# Patient Record
Sex: Male | Born: 1970 | Race: Black or African American | Hispanic: No | Marital: Single | Smoking: Current every day smoker
Health system: Southern US, Community
[De-identification: ages and names within clinical notes are randomized; demographics above are authoritative.]

## PROBLEM LIST (undated history)

## (undated) DIAGNOSIS — F319 Bipolar disorder, unspecified: Secondary | ICD-10-CM

---

## 2014-03-16 ENCOUNTER — Emergency Department (HOSPITAL_COMMUNITY)
Admission: EM | Admit: 2014-03-16 | Discharge: 2014-03-16 | Disposition: A | Discharge status: Home / Self Care | Payer: No Typology Code available for payment source | Attending: Emergency Medicine | Admitting: Emergency Medicine

## 2014-03-16 ENCOUNTER — Emergency Department (HOSPITAL_COMMUNITY): Payer: No Typology Code available for payment source

## 2014-03-16 ENCOUNTER — Encounter (HOSPITAL_COMMUNITY): Payer: Self-pay

## 2014-03-16 DIAGNOSIS — S3992XA Unspecified injury of lower back, initial encounter: Secondary | ICD-10-CM | POA: Diagnosis not present

## 2014-03-16 DIAGNOSIS — S29091A Other injury of muscle and tendon of front wall of thorax, initial encounter: Secondary | ICD-10-CM | POA: Diagnosis not present

## 2014-03-16 DIAGNOSIS — Y9389 Activity, other specified: Secondary | ICD-10-CM | POA: Diagnosis not present

## 2014-03-16 DIAGNOSIS — Y92410 Unspecified street and highway as the place of occurrence of the external cause: Secondary | ICD-10-CM | POA: Insufficient documentation

## 2014-03-16 DIAGNOSIS — S199XXA Unspecified injury of neck, initial encounter: Secondary | ICD-10-CM | POA: Diagnosis not present

## 2014-03-16 DIAGNOSIS — S29092A Other injury of muscle and tendon of back wall of thorax, initial encounter: Secondary | ICD-10-CM | POA: Diagnosis not present

## 2014-03-16 DIAGNOSIS — Y998 Other external cause status: Secondary | ICD-10-CM | POA: Diagnosis not present

## 2014-03-16 DIAGNOSIS — M545 Low back pain, unspecified: Secondary | ICD-10-CM

## 2014-03-16 LAB — I-STAT CHEM 8, ED
BUN: 16 mg/dL (ref 6–23)
Calcium, Ion: 1.18 mmol/L (ref 1.12–1.23)
Chloride: 105 mEq/L (ref 96–112)
Creatinine, Ser: 1.2 mg/dL (ref 0.50–1.35)
Glucose, Bld: 87 mg/dL (ref 70–99)
HCT: 53 % — ABNORMAL HIGH (ref 39.0–52.0)
HEMOGLOBIN: 18 g/dL — AB (ref 13.0–17.0)
Potassium: 3.4 mEq/L — ABNORMAL LOW (ref 3.7–5.3)
Sodium: 143 mEq/L (ref 137–147)
TCO2: 23 mmol/L (ref 0–100)

## 2014-03-16 LAB — I-STAT TROPONIN, ED: TROPONIN I, POC: 0 ng/mL (ref 0.00–0.08)

## 2014-03-16 MED ORDER — IBUPROFEN 800 MG PO TABS: 800.0000 mg | ORAL_TABLET | Freq: Three times a day (TID) | ORAL | Status: DC

## 2014-03-16 MED ORDER — HYDROCODONE-ACETAMINOPHEN 5-325 MG PO TABS
2.0000 | ORAL_TABLET | Freq: Once | ORAL | Status: AC
  Administered 2014-03-16: 2 via ORAL
  Filled 2014-03-16: qty 2

## 2014-03-16 MED ORDER — IOHEXOL 300 MG/ML  SOLN
100.0000 mL | Freq: Once | INTRAMUSCULAR | Status: AC | PRN
  Administered 2014-03-16: 100 mL via INTRAVENOUS

## 2014-03-16 MED ORDER — HYDROCODONE-ACETAMINOPHEN 5-325 MG PO TABS: 2.0000 | ORAL_TABLET | ORAL | Status: DC | PRN

## 2014-03-16 NOTE — ED Notes (Signed)
Pt to CT via stretcher. CT tech started IV on pt prior to leaving room.

## 2014-03-16 NOTE — ED Notes (Signed)
Ambulated Pt to end of hall ( approximal 60 ft) and back to his room (approximal 60 ft.),pt stated he was a little light headed during the walk back.
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
## 2014-03-16 NOTE — ED Notes (Signed)
Pt back from CT, MD in room to speak with patient about discharge. Brought pt a drink, he states no further needs at this time.
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
## 2014-03-16 NOTE — ED Notes (Signed)
MD at bedside. 
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
## 2017-05-11 ENCOUNTER — Inpatient Hospital Stay (HOSPITAL_COMMUNITY)
Admission: AD | Admit: 2017-05-11 | Discharge: 2017-05-15 | DRG: 885 | Payer: Federal, State, Local not specified - Other | Source: Intra-hospital | Attending: Psychiatry | Admitting: Psychiatry

## 2017-05-11 ENCOUNTER — Encounter (HOSPITAL_COMMUNITY): Payer: Self-pay | Admitting: *Deleted

## 2017-05-11 ENCOUNTER — Other Ambulatory Visit: Payer: Self-pay

## 2017-05-11 ENCOUNTER — Emergency Department (HOSPITAL_COMMUNITY)
Admission: EM | Admit: 2017-05-11 | Discharge: 2017-05-11 | Disposition: A | Discharge status: Home / Self Care | Payer: Self-pay | Attending: Emergency Medicine | Admitting: Emergency Medicine

## 2017-05-11 ENCOUNTER — Encounter (HOSPITAL_COMMUNITY): Payer: Self-pay

## 2017-05-11 DIAGNOSIS — Z79899 Other long term (current) drug therapy: Secondary | ICD-10-CM

## 2017-05-11 DIAGNOSIS — R45851 Suicidal ideations: Secondary | ICD-10-CM | POA: Insufficient documentation

## 2017-05-11 DIAGNOSIS — F250 Schizoaffective disorder, bipolar type: Secondary | ICD-10-CM | POA: Diagnosis not present

## 2017-05-11 DIAGNOSIS — Z886 Allergy status to analgesic agent status: Secondary | ICD-10-CM | POA: Diagnosis not present

## 2017-05-11 DIAGNOSIS — Z818 Family history of other mental and behavioral disorders: Secondary | ICD-10-CM

## 2017-05-11 DIAGNOSIS — Z23 Encounter for immunization: Secondary | ICD-10-CM | POA: Diagnosis not present

## 2017-05-11 DIAGNOSIS — F1590 Other stimulant use, unspecified, uncomplicated: Secondary | ICD-10-CM | POA: Diagnosis not present

## 2017-05-11 DIAGNOSIS — Z915 Personal history of self-harm: Secondary | ICD-10-CM | POA: Diagnosis not present

## 2017-05-11 DIAGNOSIS — Z590 Homelessness: Secondary | ICD-10-CM

## 2017-05-11 DIAGNOSIS — G4700 Insomnia, unspecified: Secondary | ICD-10-CM | POA: Diagnosis not present

## 2017-05-11 DIAGNOSIS — F419 Anxiety disorder, unspecified: Secondary | ICD-10-CM | POA: Diagnosis present

## 2017-05-11 DIAGNOSIS — F17210 Nicotine dependence, cigarettes, uncomplicated: Secondary | ICD-10-CM | POA: Diagnosis present

## 2017-05-11 DIAGNOSIS — F17200 Nicotine dependence, unspecified, uncomplicated: Secondary | ICD-10-CM | POA: Insufficient documentation

## 2017-05-11 DIAGNOSIS — F319 Bipolar disorder, unspecified: Secondary | ICD-10-CM | POA: Insufficient documentation

## 2017-05-11 DIAGNOSIS — F1910 Other psychoactive substance abuse, uncomplicated: Secondary | ICD-10-CM | POA: Insufficient documentation

## 2017-05-11 DIAGNOSIS — Z560 Unemployment, unspecified: Secondary | ICD-10-CM | POA: Diagnosis not present

## 2017-05-11 HISTORY — DX: Bipolar disorder, unspecified: F31.9

## 2017-05-11 LAB — CBC
HCT: 45.7 % (ref 39.0–52.0)
Hemoglobin: 15.5 g/dL (ref 13.0–17.0)
MCH: 29 pg (ref 26.0–34.0)
MCHC: 33.9 g/dL (ref 30.0–36.0)
MCV: 85.6 fL (ref 78.0–100.0)
Platelets: 297 10*3/uL (ref 150–400)
RBC: 5.34 MIL/uL (ref 4.22–5.81)
RDW: 13.9 % (ref 11.5–15.5)
WBC: 15.7 10*3/uL — ABNORMAL HIGH (ref 4.0–10.5)

## 2017-05-11 LAB — RAPID URINE DRUG SCREEN, HOSP PERFORMED
Amphetamines: NOT DETECTED
Barbiturates: NOT DETECTED
Benzodiazepines: NOT DETECTED
Cocaine: POSITIVE — AB
OPIATES: NOT DETECTED
Tetrahydrocannabinol: NOT DETECTED

## 2017-05-11 LAB — COMPREHENSIVE METABOLIC PANEL
ALT: 30 U/L (ref 17–63)
AST: 91 U/L — ABNORMAL HIGH (ref 15–41)
Albumin: 4.2 g/dL (ref 3.5–5.0)
Alkaline Phosphatase: 67 U/L (ref 38–126)
Anion gap: 11 (ref 5–15)
BUN: 19 mg/dL (ref 6–20)
CHLORIDE: 102 mmol/L (ref 101–111)
CO2: 23 mmol/L (ref 22–32)
CREATININE: 1.11 mg/dL (ref 0.61–1.24)
Calcium: 8.6 mg/dL — ABNORMAL LOW (ref 8.9–10.3)
GFR calc non Af Amer: >60 mL/min (ref >60)
Glucose, Bld: 96 mg/dL (ref 65–99)
POTASSIUM: 3.9 mmol/L (ref 3.5–5.1)
Sodium: 136 mmol/L (ref 135–145)
Total Bilirubin: 1.2 mg/dL (ref 0.3–1.2)
Total Protein: 7.9 g/dL (ref 6.5–8.1)

## 2017-05-11 LAB — ETHANOL

## 2017-05-11 LAB — SALICYLATE LEVEL: Salicylate Lvl: <7 mg/dL (ref 2.8–30.0)

## 2017-05-11 LAB — ACETAMINOPHEN LEVEL: Acetaminophen (Tylenol), Serum: <10 ug/mL — ABNORMAL LOW (ref 10–30)

## 2017-05-11 MED ORDER — NICOTINE POLACRILEX 2 MG MT GUM: 2.0000 mg | CHEWING_GUM | OROMUCOSAL | Status: DC | PRN

## 2017-05-11 MED ORDER — ACETAMINOPHEN 325 MG PO TABS
650.0000 mg | ORAL_TABLET | ORAL | Status: DC | PRN
Start: 2017-05-11 — End: 2017-05-11

## 2017-05-11 MED ORDER — NICOTINE 21 MG/24HR TD PT24
21.0000 mg | MEDICATED_PATCH | Freq: Every day | TRANSDERMAL | Status: DC
  Filled 2017-05-11: qty 1

## 2017-05-11 MED ORDER — QUETIAPINE FUMARATE 100 MG PO TABS: 100.0000 mg | ORAL_TABLET | Freq: Every day | ORAL | Status: DC

## 2017-05-11 MED ORDER — LORAZEPAM 1 MG PO TABS
0.0000 mg | ORAL_TABLET | Freq: Four times a day (QID) | ORAL | Status: DC
  Administered 2017-05-11: 1 mg via ORAL
  Filled 2017-05-11: qty 1

## 2017-05-11 MED ORDER — LAMOTRIGINE 25 MG PO TABS
25.0000 mg | ORAL_TABLET | Freq: Two times a day (BID) | ORAL | Status: DC
  Administered 2017-05-12: 25 mg via ORAL
  Filled 2017-05-11 (×7): qty 1

## 2017-05-11 MED ORDER — HYDROXYZINE HCL 25 MG PO TABS
25.0000 mg | ORAL_TABLET | Freq: Three times a day (TID) | ORAL | Status: DC | PRN
  Filled 2017-05-11: qty 1
  Filled 2017-05-11: qty 10

## 2017-05-11 MED ORDER — TRAZODONE HCL 50 MG PO TABS
50.0000 mg | ORAL_TABLET | Freq: Every evening | ORAL | Status: DC | PRN
  Filled 2017-05-11: qty 7

## 2017-05-11 MED ORDER — ALUM & MAG HYDROXIDE-SIMETH 200-200-20 MG/5ML PO SUSP
30.0000 mL | ORAL | Status: DC | PRN
Start: 2017-05-11 — End: 2017-05-15

## 2017-05-11 MED ORDER — PNEUMOCOCCAL VAC POLYVALENT 25 MCG/0.5ML IJ INJ
0.5000 mL | INJECTION | INTRAMUSCULAR | Status: AC
  Administered 2017-05-12: 0.5 mL via INTRAMUSCULAR

## 2017-05-11 MED ORDER — QUETIAPINE FUMARATE 100 MG PO TABS
100.0000 mg | ORAL_TABLET | Freq: Every day | ORAL | Status: DC
  Administered 2017-05-12: 100 mg via ORAL
  Filled 2017-05-11 (×5): qty 1

## 2017-05-11 MED ORDER — THIAMINE HCL 100 MG/ML IJ SOLN: 100.0000 mg | Freq: Every day | INTRAMUSCULAR | Status: DC

## 2017-05-11 MED ORDER — GABAPENTIN 300 MG PO CAPS
300.0000 mg | ORAL_CAPSULE | Freq: Three times a day (TID) | ORAL | Status: DC
  Administered 2017-05-11: 300 mg via ORAL
  Filled 2017-05-11: qty 1

## 2017-05-11 MED ORDER — INFLUENZA VAC SPLIT QUAD 0.5 ML IM SUSY
0.5000 mL | PREFILLED_SYRINGE | INTRAMUSCULAR | Status: AC
  Administered 2017-05-12: 0.5 mL via INTRAMUSCULAR
  Filled 2017-05-11: qty 0.5

## 2017-05-11 MED ORDER — ACETAMINOPHEN 325 MG PO TABS
650.0000 mg | ORAL_TABLET | Freq: Four times a day (QID) | ORAL | Status: DC | PRN
  Administered 2017-05-12 – 2017-05-13 (×2): 650 mg via ORAL
  Filled 2017-05-11: qty 2

## 2017-05-11 MED ORDER — GABAPENTIN 300 MG PO CAPS
300.0000 mg | ORAL_CAPSULE | Freq: Three times a day (TID) | ORAL | Status: DC
  Administered 2017-05-11 – 2017-05-15 (×10): 300 mg via ORAL
  Filled 2017-05-11 (×15): qty 1

## 2017-05-11 MED ORDER — LAMOTRIGINE 25 MG PO TABS
25.0000 mg | ORAL_TABLET | Freq: Two times a day (BID) | ORAL | Status: DC
  Administered 2017-05-11: 25 mg via ORAL
  Filled 2017-05-11: qty 1

## 2017-05-11 MED ORDER — ONDANSETRON HCL 4 MG PO TABS: 4.0000 mg | ORAL_TABLET | Freq: Three times a day (TID) | ORAL | Status: DC | PRN

## 2017-05-11 MED ORDER — VITAMIN B-1 100 MG PO TABS
100.0000 mg | ORAL_TABLET | Freq: Every day | ORAL | Status: DC
  Administered 2017-05-11: 100 mg via ORAL
  Filled 2017-05-11: qty 1

## 2017-05-11 MED ORDER — LORAZEPAM 2 MG/ML IJ SOLN: 0.0000 mg | Freq: Two times a day (BID) | INTRAMUSCULAR | Status: DC

## 2017-05-11 MED ORDER — LORAZEPAM 2 MG/ML IJ SOLN: 0.0000 mg | Freq: Four times a day (QID) | INTRAMUSCULAR | Status: DC

## 2017-05-11 MED ORDER — NICOTINE 21 MG/24HR TD PT24
21.0000 mg | MEDICATED_PATCH | Freq: Every day | TRANSDERMAL | Status: DC
  Administered 2017-05-11: 21 mg via TRANSDERMAL
  Filled 2017-05-11: qty 1

## 2017-05-11 MED ORDER — LORAZEPAM 1 MG PO TABS: 0.0000 mg | ORAL_TABLET | Freq: Two times a day (BID) | ORAL | Status: DC

## 2017-05-11 MED ORDER — MAGNESIUM HYDROXIDE 400 MG/5ML PO SUSP
30.0000 mL | Freq: Every day | ORAL | Status: DC | PRN
Start: 2017-05-11 — End: 2017-05-15

## 2017-05-11 NOTE — ED Notes (Signed)
Bed: WLPT4 Expected date:  Expected time:  Means of arrival:  Comments: 

## 2017-05-11 NOTE — ED Notes (Addendum)
Pt has two white patient belongings bags with clothes that are kept by the nurse's station in triage. Also, he has a screwdriver that was given to security for safekeeping.

## 2017-05-11 NOTE — ED Notes (Signed)
[facility]

## 2017-05-11 NOTE — BH Assessment (Signed)
[facility] Assessment Progress Note Case was staffed with Sharma CovertNorman MD who recommended a inpatient admission as appropriate bed placement is investigated.

## 2017-05-11 NOTE — BH Assessment (Signed)
[facility]  Per Juanetta BeetsJacqueline Norman, DO, this pt requires psychiatric hospitalization at this time.  Malva LimesLinsey Strader, RN, [facility]; [facility] will be ready to receive pt at 16:00.  Pt has signed Voluntary Admission and Consent for Treatment, as well as Consent to Release Information to [facility], and a notification call has been placed.  S[facility].  Pt's nurse, Donnal DebarRandi, has been notified, and agrees to send original paperwork along with pt via [facility], and to call report to [phone].  Doylene Canninghomas Hurbert Duran, [place] Behavioral Health Coordinator [phone]

## 2017-05-11 NOTE — ED Provider Notes (Signed)
[facility]-EMERGENCY DEPT Provider Note   CSN: [id number] Arrival date & time: 05/11/17  7829     History   Chief Complaint Chief Complaint  Patient presents with  . Suicidal    HPI Clifford [name] is a 47 y.o. male.  Patient presents by police with suicidal thoughts and hallucinations.  States he has been off of his bipolar medications for the past 5 days.  He is not sure what he is supposed to take but believes his Depakote, Remeron and Lamictal.  He has been abusing cocaine, heroin and amphetamines.  He denies any injection drug use.  He has been smoking these drugs and not taking any pills.  Denies any regular alcohol use.  He is worried that his thoughts would "provoke someone to kill me".  Patient is worried that he is having thoughts of wanting to hurt himself at this time.  He denies any pain.  No chest pain or abdominal pain.  No headache.   The history is provided by the patient and the police.    Past Medical History:  Diagnosis Date  . Bipolar 1 disorder (HCC)     There are no active problems to display for this patient.   History reviewed. No pertinent surgical history.     Home Medications    Prior to Admission medications   Medication Sig Start Date End Date Taking? Authorizing Provider  HYDROcodone-acetaminophen (NORCO/VICODIN) 5-325 MG per tablet Take 2 tablets by mouth every 4 (four) hours as needed. 03/16/14   Zona Pedro, Jeannett Senior, MD  ibuprofen (ADVIL,MOTRIN) 800 MG tablet Take 1 tablet (800 mg total) by mouth 3 (three) times daily. 03/16/14   Glynn Octave, MD    Family History No family history on file.  Social History Social History   Tobacco Use  . Smoking status: Current Every Day Smoker  . Smokeless tobacco: Never Used  Substance Use Topics  . Alcohol use: Yes    Comment: social  . Drug use: Yes    Types: Cocaine, Methamphetamines    Comment: heroin     Allergies   Aspirin   Review of Systems Review of  Systems  Constitutional: Negative for activity change, appetite change and fever.  HENT: Negative for congestion.   Respiratory: Negative for cough, chest tightness and shortness of breath.   Cardiovascular: Negative for chest pain.  Gastrointestinal: Negative for abdominal pain, nausea and vomiting.  Genitourinary: Negative for dysuria, hematuria and testicular pain.  Musculoskeletal: Negative for arthralgias and myalgias.  Skin: Negative for rash.  Neurological: Negative for dizziness, weakness and headaches.  Psychiatric/Behavioral: Positive for behavioral problems, decreased concentration, dysphoric mood, hallucinations, self-injury and suicidal ideas. The patient is nervous/anxious.     all other systems are negative except as noted in the HPI and PMH.    Physical Exam Updated Vital Signs BP (!) 150/99   Pulse 70   Temp 97.7 F (36.5 C) (Oral)   Resp 18   Ht 5\' 10"  (1.778 m)   Wt 97.5 kg (215 lb)   SpO2 95%   BMI 30.85 kg/m   Physical Exam  Constitutional: He is oriented to person, place, and time. He appears well-developed and well-nourished. No distress.  Calm and cooperative  HENT:  Head: Normocephalic and atraumatic.  Mouth/Throat: Oropharynx is clear and moist. No oropharyngeal exudate.  Eyes: Conjunctivae and EOM are normal. Pupils are equal, round, and reactive to light.  Neck: Normal range of motion. Neck supple.  No meningismus.  Cardiovascular: Normal rate,  regular rhythm, normal heart sounds and intact distal pulses.  No murmur heard. Pulmonary/Chest: Effort normal and breath sounds normal. No respiratory distress. He exhibits no tenderness.  Abdominal: Soft. There is no tenderness. There is no rebound and no guarding.  Musculoskeletal: Normal range of motion. He exhibits no edema or tenderness.  Neurological: He is alert and oriented to person, place, and time. No cranial nerve deficit. He exhibits normal muscle tone. Coordination normal.  No ataxia on  finger to nose bilaterally. No pronator drift. 5/5 strength throughout. CN 2-12 intact.Equal grip strength. Sensation intact.   Skin: Skin is warm. Capillary refill takes less than 2 seconds.  Psychiatric: He has a normal mood and affect. His behavior is normal.  Nursing note and vitals reviewed.    ED Treatments / Results  Labs (all labs ordered are listed, but only abnormal results are displayed) Labs Reviewed  COMPREHENSIVE METABOLIC PANEL - Abnormal; Notable for the following components:      Result Value   Calcium 8.6 (*)    AST 91 (*)    All other components within normal limits  ACETAMINOPHEN LEVEL - Abnormal; Notable for the following components:   Acetaminophen (Tylenol), Serum <10 (*)    All other components within normal limits  CBC - Abnormal; Notable for the following components:   WBC 15.7 (*)    All other components within normal limits  RAPID URINE DRUG SCREEN, HOSP PERFORMED - Abnormal; Notable for the following components:   Cocaine POSITIVE (*)    All other components within normal limits  ETHANOL  SALICYLATE LEVEL    EKG  EKG Interpretation None       Radiology No results found.  Procedures Procedures (including critical care time)  Medications Ordered in ED Medications - No data to display   Initial Impression / Assessment and Plan / ED Course  I have reviewed the triage vital signs and the nursing notes.  Pertinent labs & imaging results that were available during my care of the patient were reviewed by me and considered in my medical decision making (see chart for details).    Patient brought in police with suicidal and homicidal thoughts and polysubstance abuse.  Patient is calm and cooperative.  He admits to using multiple substances including cocaine, heroin and amphetamine.  Screening labs are unremarkable except for nonspecific leukocytosis.  No evidence of infection.  Patient is medically clear for TTS evaluation.  Holding orders  placed  Final Clinical Impressions(s) / ED Diagnoses   Final diagnoses:  Polysubstance abuse [facility](HCC)  Suicidal ideation    ED Discharge Orders    None       Dicie Edelen, Jeannett SeniorStephen, MD 05/11/17 [phone]

## 2017-05-11 NOTE — Progress Notes (Signed)
Clifford [name] is a 47 year old male being admitted voluntarily to [facility] from [facility].  He came in with suicidal ideation, homicidal ideation and substance abuse.  [facility], he continues to voice passive SI/HI and will contract for safety on the unit.  He denies HI towards anyone specific.  He reported drinking 6-8 shots of liquor daily.  He reported relapsing shortly after being released from prison in August.  He does report relapsing on crack x 2 since August as well.  He is reporting hearing voices telling him to kill himself and are negative towards him.  Oriented him to the unit.  Admission paperwork completed and signed.  Belongings searched and secured in locker # 27, no contraband found.  Skin assessment completed and no skin issues noted.  Q 15 minute checks initiated for safety.  We will continue to monitor the progress towards his goals.

## 2017-05-11 NOTE — Tx Team (Signed)
Initial Treatment Plan 05/11/2017 6:06 PM Clifford SevinIke [name] ZOX:[id number]    PATIENT STRESSORS: Financial difficulties Legal issue Substance abuse   PATIENT STRENGTHS: Wellsite geologistCommunication skills General fund of knowledge Motivation for treatment/growth   PATIENT IDENTIFIED PROBLEMS: Depression  Suicidal/homicidal ideation  Substance abuse  Psychosis  "Hopefully leave out of here a better person"  "Get on some medications so I can be a positive person"           DISCHARGE CRITERIA:  Improved stabilization in mood, thinking, and/or behavior Verbal commitment to aftercare and medication compliance Withdrawal symptoms are absent or subacute and managed without 24-hour nursing intervention  PRELIMINARY DISCHARGE PLAN: Outpatient therapy Medication management  PATIENT/FAMILY INVOLVEMENT: This treatment plan has been presented to and reviewed with the patient, Clifford [name].  The patient and family have been given the opportunity to ask questions and make suggestions.  Clifford BaconHeather V Lavi Sheehan, RN 05/11/2017, 6:06 PM

## 2017-05-11 NOTE — ED Notes (Signed)
Bed: WA31 Expected date:  Expected time:  Means of arrival:  Comments: 

## 2017-05-11 NOTE — ED Triage Notes (Signed)
Pt brought in voluntary by [employer] with SI thoughts, states he would "provoke someone to kill me". Reports being off his meds since Wednesday. Reports hearing voices. Last used cocaine today.
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
## 2017-05-11 NOTE — ED Notes (Signed)
ED Provider at bedside. 

## 2017-05-12 DIAGNOSIS — Z79899 Other long term (current) drug therapy: Secondary | ICD-10-CM

## 2017-05-12 DIAGNOSIS — G4700 Insomnia, unspecified: Secondary | ICD-10-CM

## 2017-05-12 DIAGNOSIS — F250 Schizoaffective disorder, bipolar type: Principal | ICD-10-CM

## 2017-05-12 DIAGNOSIS — F419 Anxiety disorder, unspecified: Secondary | ICD-10-CM

## 2017-05-12 LAB — LIPID PANEL
CHOLESTEROL: 177 mg/dL (ref 0–200)
HDL: 43 mg/dL (ref >40)
LDL Cholesterol: 109 mg/dL — ABNORMAL HIGH (ref 0–99)
Total CHOL/HDL Ratio: 4.1 RATIO
Triglycerides: 125 mg/dL (ref <150)
VLDL: 25 mg/dL (ref 0–40)

## 2017-05-12 LAB — HEMOGLOBIN A1C
Hgb A1c MFr Bld: 5.8 % — ABNORMAL HIGH (ref 4.8–5.6)
Mean Plasma Glucose: 119.76 mg/dL

## 2017-05-12 LAB — TSH: TSH: 0.346 u[IU]/mL — AB (ref 0.350–4.500)

## 2017-05-12 MED ORDER — DIVALPROEX SODIUM 500 MG PO DR TAB
500.0000 mg | DELAYED_RELEASE_TABLET | Freq: Two times a day (BID) | ORAL | Status: DC
  Administered 2017-05-12 – 2017-05-15 (×7): 500 mg via ORAL
  Filled 2017-05-12 (×10): qty 1

## 2017-05-12 NOTE — Progress Notes (Signed)
DAR NOTE: Patient presents with flat affect and depressed mood.  Reports suicidal thoughts, auditory and visual hallucinations but verbally contracts for safety.  Described energy level as low and concentration as poor.  Rates depression at 7, hopelessness at 7, and anxiety at 7.  Maintained on routine safety checks.  Medications given as prescribed.  Support and encouragement offered as needed.  Attended group and participated.  States goal for today is "feeling better."  Patient visible in milieu with minimal interactions.  Offered no complaint.

## 2017-05-12 NOTE — Progress Notes (Signed)
Recreation Therapy Notes  Date: 05/12/17 Time: 1000 Location: 500 Hall Dayroom  Group Topic: Communication  Goal Area(s) Addresses:  Patient will effectively communicate with peers in group.  Patient will verbalize benefit of healthy communication. Patient will verbalize positive effect of healthy communication on post d/c goals.  Patient will identify communication techniques that made activity effective for group.   Behavioral Response: Minimal  Intervention: Geometrical drawings, blank paper, pencils    Activity: Geometrical Drawings.  One patient would describe [name] picture to the rest of the group.  The group would then draw the picture being described to them on their own individual sheets of paper.  The drawers could not ask any questions of the reader.  When completed, the group would examine their drawings against the original to see how close they came.  Education: Communication, Discharge Planning  Education Outcome: Acknowledges understanding/In group clarification offered/Needs additional education.   Clinical Observations/Feedback: Pt arrived late.  Pt did not draw the pictures.  Pt stated during processing, that speaking clear and loud enough for the person to hear you, helps to make sure your point is getting across.  Pt also stated he didn't think hand gestures were offensive because "I've seen professional people use it and sometimes it helps the person better explain what they are saying".    Caroll RancherMarjette Shawana Knoch, LRT/CTRS     Caroll RancherLindsay, Clifford [name] 05/12/2017 11:24 AM
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
## 2017-05-12 NOTE — Progress Notes (Signed)
Adult Psychoeducational Group Note  Date:  05/12/2017 Time:  8:47 PM  Group Topic/Focus:  Wrap-Up Group:   The focus of this group is to help patients review their daily goal of treatment and discuss progress on daily workbooks.  Participation Level:  Active  Participation Quality:  Appropriate  Affect:  Appropriate  Cognitive:  Appropriate  Insight: Appropriate  Engagement in Group:  Engaged  Modes of Intervention:  Discussion  Additional Comments:The patient expressed that he attended Recovery and Diagnosis Group.  Octavio Mannshigpen, Dhwani Venkatesh Lee 05/12/2017, 8:47 PM

## 2017-05-12 NOTE — [facility] Group Notes (Signed)
[facility] LCSW Group Therapy Note  Date/Time: 05/11/17, 1100  Type of Therapy/Topic:  Group Therapy:  Feelings about Diagnosis  Participation Level:  Active   Mood:pleasant   Description of Group:    This group will allow patients to explore their thoughts and feelings about diagnoses they have received. Patients will be guided to explore their level of understanding and acceptance of these diagnoses. Facilitator will encourage patients to process their thoughts and feelings about the reactions of others to their diagnosis, and will guide patients in identifying ways to discuss their diagnosis with significant others in their lives. This group will be process-oriented, with patients participating in exploration of their own experiences as well as giving and receiving support and challenge from other group members.   Therapeutic Goals: 1. Patient will demonstrate understanding of diagnosis as evidence by identifying two or more symptoms of the disorder:  2. Patient will be able to express two feelings regarding the diagnosis 3. Patient will demonstrate ability to communicate their needs through discussion and/or role plays  Summary of Patient Progress:Pt was attentive and showed good participation in group.  Pt shared about his experiences with "stigma" and with family members telling him that "you don't need those pills".  Pt also shared that he has been given several diagnoses by several different MDs, leading to him not being sure what his actual diagnosis is.        Therapeutic Modalities:   Cognitive Behavioral Therapy Brief Therapy Feelings Identification   Daleen SquibbGreg Elston Aldape, LCSW

## 2017-05-12 NOTE — BHH Suicide Risk Assessment (Signed)
[facility] Admission Suicide Risk Assessment   Nursing information obtained from:  Patient Demographic factors:  Male, Low socioeconomic status, Unemployed Current Mental Status:  Self-harm thoughts Loss Factors:  Decrease in vocational status, Legal issues, Financial problems / change in socioeconomic status Historical Factors:  Prior suicide attempts, Family history of mental illness or substance abuse Risk Reduction Factors:  Living with another person, especially a relative  Total Time spent with patient: 1 hour Principal Problem: Schizoaffective disorder, bipolar type (HCC) Diagnosis:   Patient Active Problem List   Diagnosis Date Noted  . Schizoaffective disorder, bipolar type (HCC) [F25.0] 05/11/2017   Subjective Data: See H&P for full HPI Clifford [name] is a 47 y/o M with history of schizophrenia and bipolar disorder who was admitted voluntarily with worsening symptoms of depression, SI with plan to shoot self, AH, and illicit substance use of crack cocaine, heroin, methamphetamine, and alcohol. Pt has recent relevant history of discharge from prison in August 2018 and he was set up with outpatient services at [facility], and he reports good adherence to his outpatient regimen until about 1 week ago when he had relapse of substance use. Pt agreed to be restarted on depakote and he will start trial of seroquel.   Continued Clinical Symptoms:  Alcohol Use Disorder Identification Test Final Score (AUDIT): 29 The "Alcohol Use Disorders Identification Test", Guidelines for Use in Primary Care, Second Edition.  World Science writer [facility]). Score between 0-7:  no or low risk or alcohol related problems. Score between 8-15:  moderate risk of alcohol related problems. Score between 16-19:  high risk of alcohol related problems. Score 20 or above:  warrants further diagnostic evaluation for alcohol dependence and treatment.   CLINICAL FACTORS:   Severe Anxiety and/or Agitation Bipolar Disorder:    Depressive phase Alcohol/Substance Abuse/Dependencies Schizophrenia:   Command hallucinatons Paranoid or undifferentiated type Currently Psychotic Unstable or Poor Therapeutic Relationship   Musculoskeletal: Strength & Muscle Tone: within normal limits Gait & Station: normal Patient leans: N/A  Psychiatric Specialty Exam: Physical Exam  Nursing note and vitals reviewed.   ROS - see H&P  Blood pressure (!) 135/94, pulse 77, temperature 98.8 F (37.1 C), temperature source Oral, resp. rate 18, height 5\' 10"  (1.778 m), weight 95.3 kg (210 lb).Body mass index is 30.13 kg/m.  General Appearance: Casual  Eye Contact:  Good  Speech:  Clear and Coherent and Normal Rate  Volume:  Normal  Mood:  Anxious  Affect:  Appropriate, Congruent, Constricted and Flat  Thought Process:  Coherent and Goal Directed  Orientation:  Full (Time, Place, and Person)  Thought Content:  Hallucinations: Auditory Command:  to kill self and others  Suicidal Thoughts:  Yes.  with intent/plan  Homicidal Thoughts:  No  Memory:  Immediate;   Fair Recent;   Fair Remote;   Fair  Judgement:  Impaired  Insight:  Lacking  Psychomotor Activity:  Normal  Concentration:  Concentration: Fair  Recall:  [employer] of Knowledge:  Fair  Language:  Fair  Akathisia:  No  Handed:    AIMS (if indicated):     Assets:  Communication Skills Desire for Improvement Leisure Time Physical Health Resilience  ADL's:  Intact  Cognition:  WNL  Sleep:  Number of Hours: 6.75        COGNITIVE FEATURES THAT CONTRIBUTE TO RISK:  None    SUICIDE RISK:   Mild:  Suicidal ideation of limited frequency, intensity, duration, and specificity.  There are no identifiable plans, no  associated intent, mild dysphoria and related symptoms, good self-control (both objective and subjective assessment), few other risk factors, and identifiable protective factors, including available and accessible social support.  PLAN OF CARE:    -Admit to inpatient level of care  Labs: CBC, CMP, TSH, lipid panel, prolactin, HGBA1C, EKG  -Schizoaffective disorder, bipolar type   - Restart depakote DR 500mg  po BID   - Start seroquel 100mg  po qhs  -Anxiety  - Continue gabapentin 300mg  po TID  - Continue atarax 25mg  po TID prn anxiety  -Insomnia   - Start trazodone 50mg  po qhs prn insomnia  -Encourage participation in groups and the therapeutic milieu  -Discharge planning will be ongoing  I certify that inpatient services furnished can reasonably be expected to improve the patient's condition.   Clifford Likenshristopher T Alenna Russell, MD 05/12/2017, 5:09 PM
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
## 2017-05-12 NOTE — Progress Notes (Signed)
Adult Psychoeducational Group Note  Date:  05/12/2017 Time:  6:39 PM  Group Topic/Focus:  Recovery Goals:   The focus of this group is to identify appropriate goals for recovery and establish a plan to achieve them.  Participation Level:  Active  Participation Quality:  Appropriate  Affect:  Appropriate  Cognitive:  Appropriate  Insight: Appropriate  Engagement in Group:  Engaged  Modes of Intervention:  Discussion  Additional Comments:  Pt did actively participated, and discussed what the meaning of recovery was to him.  Wynema BirchCagle, Kayla Weekes D 05/12/2017, 6:39 PM

## 2017-05-12 NOTE — Progress Notes (Signed)
Recreation Therapy Notes  INPATIENT RECREATION THERAPY ASSESSMENT  Patient Details Name: Clifford [name] MRN: [id number] DOB: 12/13/1970 Today's Date: 05/12/2017       Information Obtained From: Patient  Able to Participate in Assessment/Interview: Yes  Patient Presentation: Alert, Oriented, Responsive  Reason for Admission (Per Patient): Suicidal Ideation  Patient Stressors: Work(Not being able to find a job)  Coping Skills:   Counselling psychologistelf-Injury, Music, Aggression, TV, Sports, Meditate, Substance Abuse, Impulsivity, Talk, Avoidance, Read  Leisure Interests (2+):  Exercise - Lifting Weights, Individual - Other (Comment)(Watch sports, the internet)  Frequency of Recreation/Participation: Weekly  Awareness of Community Resources:  Yes  Community Resources:  [place], [place]  Current Use: No  If no, Barriers?: Other (Comment)(Couldn't get mind focused)  Expressed Interest in State Street CorporationCommunity Resource Information: Yes  Patient Main Form of Transportation: Therapist, musicublic Transportation  Patient Strengths:  Independent, Survivor  Patient Identified Areas of Improvement:  Mental State, Substance Abuse  Current Recreation Participation:  Weekly  Patient Goal for Hospitalization:  "Learn how to manage depression and stay away from substance"  [place]  [place]  Current SI (including self-harm):  Yes(Rated a 7.5; Contracts for safety)  Current HI:  No  Current AVH: Yes(Rated a 7.5)  Staff Intervention Plan: Group Attendance  Consent to Intern Participation: N/A    Caroll RancherMarjette Jeziel Hoffmann, LRT/CTRS  Lillia AbedLindsay, Seraphim Affinito A 05/12/2017, 11:12 AM

## 2017-05-12 NOTE — Progress Notes (Signed)
Nursing Progress Note: 7p-7a D: Pt currently presents with a sad/depressed affect and behavior. Pt states "I see dots and hear voices to tell me to do weird shit like hurt people." Interacting minimally with the milieu. Pt reports good sleep during the previous night with current medication regimen. Pt did not attend wrap-up group.  A: Pt provided with medications per providers orders. Pt's labs and vitals were monitored throughout the night. Pt supported emotionally and encouraged to express concerns and questions. Pt educated on medications.  R: Pt's safety ensured with 15 minute and environmental checks. Pt currently denies SI, HI, and AVH. Pt verbally contracts to seek staff if SI,HI, or AVH occurs and to consult with staff before acting on any harmful thoughts. Will continue to monitor.

## 2017-05-13 LAB — PROLACTIN: Prolactin: 14.3 ng/mL (ref 4.0–15.2)

## 2017-05-13 MED ORDER — QUETIAPINE FUMARATE 50 MG PO TABS
150.0000 mg | ORAL_TABLET | Freq: Every day | ORAL | Status: DC
  Administered 2017-05-13 – 2017-05-14 (×2): 150 mg via ORAL
  Filled 2017-05-13 (×4): qty 1

## 2017-05-13 NOTE — Progress Notes (Signed)
Recreation Therapy Notes  Date: 05/13/17 Time: 1000 Location: 500 Hall Dayroom  Group Topic: Coping Skills  Goal Area(s) Addresses:  Patient will be able to identify positive coping skills. Patient will be able to identify benefits of using coping skills post d/c.  Intervention: Worksheet, pencils   Activity: Web Design.  Patients were to write the things that have had them stuck or stressed inside of the web.  Patients were to then come up with at least three coping skills and write them outside of the web.  Education: Coping Skills, Discharge Planning.   Education Outcome: Acknowledges understanding/In group clarification offered/Needs additional education.   Clinical Observations/Feedback: Pt did not attend group.    Bladyn Tipps, LRT/CTRS         Terre Zabriskie A 05/13/2017 12:07 PM 

## 2017-05-13 NOTE — [facility] Suicide Risk Assessment (Signed)
[facility] INPATIENT:  Family/Significant Other Suicide Prevention Education  Suicide Prevention Education:  Patient Refusal for Family/Significant Other Suicide Prevention Education: The patient Clifford [name] has refused to provide written consent for family/significant other to be provided Family/Significant Other Suicide Prevention Education during admission and/or prior to discharge.  Physician notified.  Lorri FrederickWierda, Chantell Kunkler Jon, LCSW 05/13/2017, 11:42 AM

## 2017-05-13 NOTE — Progress Notes (Signed)
Patient stated he still has SI thoughts, contracts for safety, no plan at this time.  Denied HI.  Stated he continues to have A/V hallucinations but they are better now also.  Did not want to discuss at this time.

## 2017-05-13 NOTE — Tx Team (Signed)
Interdisciplinary Treatment and Diagnostic Plan Update  05/13/2017 Time of Session: 0930 Rudolpho Sevinke [name] MRN: [id number]  Principal Diagnosis: Schizoaffective disorder, bipolar type [facility](HCC)  Secondary Diagnoses: Principal Problem:   Schizoaffective disorder, bipolar type (HCC)   Current Medications:  Current Facility-Administered Medications  Medication Dose Route Frequency Provider Last Rate Last Dose  . acetaminophen (TYLENOL) tablet 650 mg  650 mg Oral Q6H PRN Laveda AbbeParks, Laurie Britton, NP   650 mg at 05/12/17 1017  . alum & mag hydroxide-simeth (MAALOX/MYLANTA) 200-200-20 MG/5ML suspension 30 mL  30 mL Oral Q4H PRN Laveda AbbeParks, Laurie Britton, NP      . divalproex (DEPAKOTE) DR tablet 500 mg  500 mg Oral Q12H Micheal Likensainville, Christopher T, MD   500 mg at 05/13/17 0742  . gabapentin (NEURONTIN) capsule 300 mg  300 mg Oral TID Laveda AbbeParks, Laurie Britton, NP   300 mg at 05/13/17 [id number]  . hydrOXYzine (ATARAX/VISTARIL) tablet 25 mg  25 mg Oral TID PRN Laveda AbbeParks, Laurie Britton, NP      . magnesium hydroxide (MILK OF MAGNESIA) suspension 30 mL  30 mL Oral Daily PRN Laveda AbbeParks, Laurie Britton, NP      . nicotine polacrilex (NICORETTE) gum 2 mg  2 mg Oral PRN Micheal Likensainville, Christopher T, MD      . ondansetron [facility](ZOFRAN) tablet 4 mg  4 mg Oral Q8H PRN Laveda AbbeParks, Laurie Britton, NP      . QUEtiapine (SEROQUEL) tablet 100 mg  100 mg Oral QHS Laveda AbbeParks, Laurie Britton, NP   100 mg at 05/12/17 2200  . traZODone (DESYREL) tablet 50 mg  50 mg Oral QHS PRN Laveda AbbeParks, Laurie Britton, NP       PTA Medications: Medications Prior to Admission  Medication Sig Dispense Refill Last Dose  . divalproex (DEPAKOTE ER) 500 MG 24 hr tablet Take 500 mg by mouth daily.   Past Week at Unknown time  . mirtazapine (REMERON) 15 MG tablet Take 15 mg by mouth at bedtime.    Past Week at Unknown time  . OLANZapine (ZYPREXA) 10 MG tablet Take 10 mg by mouth at bedtime.   Past Week at Unknown time    Patient Stressors: Paediatric nurseinancial difficulties Legal issue Substance  abuse  Patient Strengths: DentistCommunication skills General fund of knowledge Motivation for treatment/growth  Treatment Modalities: Medication Management, Group therapy, Case management,  1 to 1 session with clinician, Psychoeducation, Recreational therapy.   Physician Treatment Plan for Primary Diagnosis: Schizoaffective disorder, bipolar type (HCC) Long Term Goal(s): Improvement in symptoms so as ready for discharge Improvement in symptoms so as ready for discharge   Short Term Goals: Compliance with prescribed medications will improve Ability to identify triggers associated with substance abuse/mental health issues will improve  Medication Management: Evaluate patient's response, side effects, and tolerance of medication regimen.  Therapeutic Interventions: 1 to 1 sessions, Unit Group sessions and Medication administration.  Evaluation of Outcomes: Progressing  Physician Treatment Plan for Secondary Diagnosis: Principal Problem:   Schizoaffective disorder, bipolar type (HCC)  Long Term Goal(s): Improvement in symptoms so as ready for discharge Improvement in symptoms so as ready for discharge   Short Term Goals: Compliance with prescribed medications will improve Ability to identify triggers associated with substance abuse/mental health issues will improve     Medication Management: Evaluate patient's response, side effects, and tolerance of medication regimen.  Therapeutic Interventions: 1 to 1 sessions, Unit Group sessions and Medication administration.  Evaluation of Outcomes: Progressing   RN Treatment Plan for Primary Diagnosis: Schizoaffective disorder, bipolar type (HCC) Long Term  Goal(s): Knowledge of disease and therapeutic regimen to maintain health will improve  Short Term Goals: Ability to identify and develop effective coping behaviors will improve and Compliance with prescribed medications will improve  Medication Management: RN will administer medications as  ordered by provider, will assess and evaluate patient's response and provide education to patient for prescribed medication. RN will report any adverse and/or side effects to prescribing provider.  Therapeutic Interventions: 1 on 1 counseling sessions, Psychoeducation, Medication administration, Evaluate responses to treatment, Monitor vital signs and CBGs as ordered, Perform/monitor CIWA, COWS, AIMS and Fall Risk screenings as ordered, Perform wound care treatments as ordered.  Evaluation of Outcomes: Progressing   LCSW Treatment Plan for Primary Diagnosis: Schizoaffective disorder, bipolar type (HCC) Long Term Goal(s): Safe transition to appropriate next level of care at discharge, Engage patient in therapeutic group addressing interpersonal concerns.  Short Term Goals: Engage patient in aftercare planning with referrals and resources, Increase social support and Increase skills for wellness and recovery  Therapeutic Interventions: Assess for all discharge needs, 1 to 1 time with Social worker, Explore available resources and support systems, Assess for adequacy in community support network, Educate family and significant other(s) on suicide prevention, Complete Psychosocial Assessment, Interpersonal group therapy.  Evaluation of Outcomes: Progressing   Progress in Treatment: Attending groups: Yes. Participating in groups: Yes. Taking medication as prescribed: Yes. Toleration medication: Yes. Family/Significant other contact made: No, will contact:  pt declined Patient understands diagnosis: Yes. Discussing patient identified problems/goals with staff: Yes. Medical problems stabilized or resolved: Yes. Denies suicidal/homicidal ideation: Yes. Issues/concerns per patient self-inventory: No. Other: none  New problem(s) identified: No, Describe:  none  New Short Term/Long Term Goal(s):Pt goal: get better, stop the voices, help with substance use.  Discharge Plan or Barriers: Pursue  residential substance use treatment.  Reason for Continuation of Hospitalization: Hallucinations Medication stabilization  Estimated Length of Stay: 3-5 days.  Attendees: Patient:Clifford [name] 05/13/2017   Physician: Dr Altamese Vermillion, MD 05/13/2017   Nursing: Elby Beck, RN 05/13/2017   RN Care Manager: 05/13/2017   Social Worker: Daleen Squibb, LCSW 05/13/2017   Recreational Therapist:    Other: Enid Cutter, intern 05/13/2017   Other:  05/13/2017   Other: 05/13/2017     Scribe for Treatment Team: Lorri Frederick, LCSW 05/13/2017 10:12 AM

## 2017-05-13 NOTE — Progress Notes (Signed)
Adult Psychoeducational Group Note  Date:  05/13/2017 Time:  4:46 PM  Group Topic/Focus:  Developing a Wellness Toolbox:   The focus of this group is to help patients develop a "wellness toolbox" with skills and strategies to promote recovery upon discharge.  Participation Level:  Active  Participation Quality:  Appropriate and Attentive  Affect:  Appropriate  Cognitive:  Alert and Appropriate  Insight: Appropriate and Good  Engagement in Group:  Engaged and Improving  Modes of Intervention:  Discussion and Education  Additional Comments:  Topic was on personal development.  Patient was attentive and participated.  Audrie Lializabeth O Shreyan Hinz 05/13/2017, 4:46 PM

## 2017-05-13 NOTE — Progress Notes (Signed)
[facility] MD Progress Note  05/13/2017 4:46 PM Clifford [name]  MRN:  [id number] Subjective:    Clifford [name] is a 47 y/o M with history of schizophrenia and bipolar disorder who was admitted voluntarily with worsening symptoms of depression, SI with plan to shoot self, AH, and illicit substance use of crack cocaine, heroin, methamphetamine, and alcohol. Pt has recent relevant history of discharge from prison in August 2018 and he was set up with outpatient services at [facility], and he reports good adherence to his outpatient regimen until about 1 week ago when he had relapse of substance use. Pt agreed to be restarted on previous medication of depakote and to attempt trial of seroquel. Pt worked with SW team about plan to seek substance use treatment at [facility] after stabilization at [facility].  Today upon evaluation, pt shares, "I'm doing better - starting to feel better; the voices are coming down and the suicidal thoughts are coming down." Pt rates AH intensity of "5/10" as compared to "10/10" on arrival, and he rates SI as "5/10" in intensity compared to "10/10" on arrival. He denies HI/VH. He is sleeping well. His appetite is good. He feels that his medications have been helpful, and he would like to continue with them. Discussed with patient about potential of increasing dose of seroquel at bedtime, and he was in agreement. Pt remains in agreement with plan for transfer to [facility] for substance use treatment after he is stabilized at [facility], and we discussed potential discharge on morning of Tuesday 2/19, and pt felt that he would likely be ready at that point if he continued to have improvement of his presenting symptoms. Pt was in agreement with the above plan, and he had no further questions, comments, or concerns.   Principal Problem: Schizoaffective disorder, bipolar type (HCC) Diagnosis:   Patient Active Problem List   Diagnosis Date Noted  . Schizoaffective disorder, bipolar type (HCC) [F25.0] 05/11/2017    Total Time spent with patient: 30 minutes  Past Psychiatric History: see H&P  Past Medical History:  Past Medical History:  Diagnosis Date  . Bipolar 1 disorder (HCC)    History reviewed. No pertinent surgical history. Family History: History reviewed. No pertinent family history. Family Psychiatric  History: see H&P Social History:  Social History   Substance and Sexual Activity  Alcohol Use Yes   Comment: social     Social History   Substance and Sexual Activity  Drug Use Yes  . Types: Cocaine, Methamphetamines   Comment: heroin    Social History   Socioeconomic History  . Marital status: Single    Spouse name: None  . Number of children: None  . Years of education: None  . Highest education level: None  Social Needs  . Financial resource strain: None  . Food insecurity - worry: None  . Food insecurity - inability: None  . Transportation needs - medical: None  . Transportation needs - non-medical: None  Occupational History  . None  Tobacco Use  . Smoking status: Current Every Day Smoker  . Smokeless tobacco: Never Used  Substance and Sexual Activity  . Alcohol use: Yes    Comment: social  . Drug use: Yes    Types: Cocaine, Methamphetamines    Comment: heroin  . Sexual activity: None  Other Topics Concern  . None  Social History Narrative  . None   Additional Social History:  Sleep: Good  Appetite:  Good  Current Medications: Current Facility-Administered Medications  Medication Dose Route Frequency Provider Last Rate Last Dose  . acetaminophen (TYLENOL) tablet 650 mg  650 mg Oral Q6H PRN Laveda AbbeParks, Laurie Britton, NP   650 mg at 05/12/17 1017  . alum & mag hydroxide-simeth (MAALOX/MYLANTA) 200-200-20 MG/5ML suspension 30 mL  30 mL Oral Q4H PRN Laveda AbbeParks, Laurie Britton, NP      . divalproex (DEPAKOTE) DR tablet 500 mg  500 mg Oral Q12H Micheal Likensainville, Tyeler Goedken T, MD   500 mg at 05/13/17 0742  . gabapentin (NEURONTIN)  capsule 300 mg  300 mg Oral TID Laveda AbbeParks, Laurie Britton, NP   300 mg at 05/13/17 1152  . hydrOXYzine (ATARAX/VISTARIL) tablet 25 mg  25 mg Oral TID PRN Laveda AbbeParks, Laurie Britton, NP      . magnesium hydroxide (MILK OF MAGNESIA) suspension 30 mL  30 mL Oral Daily PRN Laveda AbbeParks, Laurie Britton, NP      . nicotine polacrilex (NICORETTE) gum 2 mg  2 mg Oral PRN Micheal Likensainville, Jshaun Abernathy T, MD      . ondansetron [facility](ZOFRAN) tablet 4 mg  4 mg Oral Q8H PRN Laveda AbbeParks, Laurie Britton, NP      . QUEtiapine (SEROQUEL) tablet 100 mg  100 mg Oral QHS Laveda AbbeParks, Laurie Britton, NP   100 mg at 05/12/17 2200  . traZODone (DESYREL) tablet 50 mg  50 mg Oral QHS PRN Laveda AbbeParks, Laurie Britton, NP        Lab Results:  Results for orders placed or performed during the hospital encounter of 05/11/17 (from the past 48 hour(s))  TSH     Status: Abnormal   Collection Time: 05/12/17  6:43 PM  Result Value Ref Range   TSH 0.346 (L) 0.350 - 4.500 uIU/mL    Comment: Performed by a 3rd Generation assay with a functional sensitivity of <=0.01 uIU/mL. Performed at [facility], [address] [place], [place] [id number]   Lipid panel     Status: Abnormal   Collection Time: 05/12/17  6:43 PM  Result Value Ref Range   Cholesterol 177 0 - 200 mg/dL   Triglycerides 604125 <540<150 mg/dL   HDL 43 >98>40 mg/dL   Total CHOL/HDL Ratio 4.1 RATIO   VLDL 25 0 - 40 mg/dL   LDL Cholesterol 119109 (H) 0 - 99 mg/dL    Comment:        Total Cholesterol/HDL:CHD Risk Coronary Heart Disease Risk Table                     Men   Women  1/2 Average Risk   3.4   3.3  Average Risk       5.0   4.4  2 X Average Risk   9.6   7.1  3 X Average Risk  23.4   11.0        Use the calculated Patient Ratio above and the CHD Risk Table to determine the patient's CHD Risk.        ATP III CLASSIFICATION (LDL):  <100     mg/dL   Optimal  [id number]  mg/dL   Near or Above                    Optimal  130-159  mg/dL   Borderline  562-130160-189  mg/dL   High  >865>190     mg/dL    Very High Performed at [facility], [address] [place] [address]   Hemoglobin A1c     Status: Abnormal   Collection Time: 05/12/17  6:43 PM  Result Value Ref Range   Hgb A1c MFr Bld 5.8 (H) 4.8 - 5.6 %    Comment: (NOTE) Pre diabetes:          5.7%-6.4% Diabetes:              >6.4% Glycemic control for   <7.0% adults with diabetes    Mean Plasma Glucose 119.76 mg/dL    Comment: Performed at [facility], [address], [place], [place] [address]  Prolactin     Status: None   Collection Time: 05/12/17  6:43 PM  Result Value Ref Range   Prolactin 14.3 4.0 - 15.2 ng/mL    Comment: (NOTE) Performed At: [facility] [address] [place], [place] [id number] Jolene Schimke MD OZ:[id number] Performed at [facility], [address], [place], [place] [address]     Blood Alcohol level:  Lab Results  Component Value Date   ETH <10 05/11/2017    Metabolic Disorder Labs: Lab Results  Component Value Date   HGBA1C 5.8 (H) 05/12/2017   MPG 119.76 05/12/2017   Lab Results  Component Value Date   PROLACTIN 14.3 05/12/2017   Lab Results  Component Value Date   CHOL 177 05/12/2017   TRIG 125 05/12/2017   HDL 43 05/12/2017   CHOLHDL 4.1 05/12/2017   VLDL 25 05/12/2017   LDLCALC 109 (H) 05/12/2017    Physical Findings: AIMS: Facial and Oral Movements Muscles of Facial Expression: None, normal Lips and Perioral Area: None, normal Jaw: None, normal Tongue: None, normal,Extremity Movements Upper (arms, wrists, hands, fingers): None, normal Lower (legs, knees, ankles, toes): None, normal, Trunk Movements Neck, shoulders, hips: None, normal, Overall Severity Severity of abnormal movements (highest score from questions above): None, normal Incapacitation due to abnormal movements: None, normal Patient's awareness of abnormal movements (rate only patient's report): No Awareness, Dental Status Current problems  with teeth and/or dentures?: No Does patient usually wear dentures?: No  CIWA:    COWS:     Musculoskeletal: Strength & Muscle Tone: within normal limits Gait & Station: normal Patient leans: N/A  Psychiatric Specialty Exam: Physical Exam  Nursing note and vitals reviewed.   Review of Systems  Constitutional: Negative for chills and fever.  Respiratory: Negative for cough and shortness of breath.   Cardiovascular: Negative for chest pain.  Gastrointestinal: Negative for abdominal pain, heartburn, nausea and vomiting.  Psychiatric/Behavioral: Positive for depression, hallucinations and suicidal ideas. The patient is nervous/anxious. The patient does not have insomnia.     Blood pressure (!) 135/94, pulse 77, temperature 98.8 F (37.1 C), temperature source Oral, resp. rate 18, height 5\' 10"  (1.778 m), weight 95.3 kg (210 lb).Body mass index is 30.13 kg/m.  General Appearance: Casual and Fairly Groomed  Eye Contact:  Good  Speech:  Clear and Coherent and Normal Rate  Volume:  Normal  Mood:  Anxious and Euthymic  Affect:  Appropriate, Congruent and Constricted  Thought Process:  Coherent and Goal Directed  Orientation:  Full (Time, Place, and Person)  Thought Content:  Hallucinations: Auditory  Suicidal Thoughts:  Yes.  without intent/plan  Homicidal Thoughts:  No  Memory:  Immediate;   Fair Recent;   Fair Remote;   Fair  Judgement:  Fair  Insight:  Fair  Psychomotor Activity:  Normal  Concentration:  Concentration: Fair  Recall:  [employer] of Knowledge:  Fair  Language:  Good  Akathisia:  No  Handed:    AIMS (if indicated):     Assets:  Communication Skills Physical Health Resilience Social Support  ADL's:  Intact  Cognition:  WNL  Sleep:  Number of Hours: 6.75   Treatment Plan Summary: Daily contact with patient to assess and evaluate symptoms and progress in treatment and Medication management. Pt reports improvement of AH, SI, and depression since his  admission, and he agrees to increase dose of seroquel tonight. We will plan to obtain depakote level on AM of 2/17. Pt is in agreement with plan for discharge to [facility] for substance use treatment after improvement of his presenting symptoms and we are potentially hoping for DC on AM of 2/19.  -Continue inpatient hospitalization  -Schizoaffective disorder, bipolar type             - Continue depakote DR 500mg  po BID             - Change seroquel 100mg  po qhs to seroquel 150mg  po qhs  -Anxiety             - Continue gabapentin 300mg  po TID             - Continue atarax 25mg  po TID prn anxiety  -Insomnia             - Continue trazodone 50mg  po qhs prn insomnia  -Encourage participation in groups and the therapeutic milieu  -Discharge planning will be ongoing  Micheal Likens, MD 05/13/2017, 4:46 PM

## 2017-05-13 NOTE — [facility] Group Notes (Signed)
[facility] LCSW Group Therapy Note  Date/Time: 05/13/17, 1315  Type of Therapy and Topic:  Group Therapy:  Overcoming Obstacles  Participation Level:  active  Description of Group:    In this group patients will be encouraged to explore what they see as obstacles to their own wellness and recovery. They will be guided to discuss their thoughts, feelings, and behaviors related to these obstacles. The group will process together ways to cope with barriers, with attention given to specific choices patients can make. Each patient will be challenged to identify changes they are motivated to make in order to overcome their obstacles. This group will be process-oriented, with patients participating in exploration of their own experiences as well as giving and receiving support and challenge from other group members.  Therapeutic Goals: 1. Patient will identify personal and current obstacles as they relate to admission. 2. Patient will identify barriers that currently interfere with their wellness or overcoming obstacles.  3. Patient will identify feelings, thought process and behaviors related to these barriers. 4. Patient will identify two changes they are willing to make to overcome these obstacles:    Summary of Patient Progress: Pt shared that substances was the major obstacle in his life currently.  Pt very active in group discussion about taking steps to overcome obstacles and also on keeping a positive mindset while facing obstacles in life.      Therapeutic Modalities:   Cognitive Behavioral Therapy Solution Focused Therapy Motivational Interviewing Relapse Prevention Therapy  Daleen SquibbGreg Teagen Mcleary, LCSW

## 2017-05-14 DIAGNOSIS — F1590 Other stimulant use, unspecified, uncomplicated: Secondary | ICD-10-CM

## 2017-05-14 DIAGNOSIS — F1490 Cocaine use, unspecified, uncomplicated: Secondary | ICD-10-CM

## 2017-05-14 DIAGNOSIS — F17200 Nicotine dependence, unspecified, uncomplicated: Secondary | ICD-10-CM

## 2017-05-14 DIAGNOSIS — F1190 Opioid use, unspecified, uncomplicated: Secondary | ICD-10-CM

## 2017-05-14 DIAGNOSIS — Z7289 Other problems related to lifestyle: Secondary | ICD-10-CM

## 2017-05-14 MED ORDER — LORAZEPAM 2 MG/ML IJ SOLN
2.0000 mg | Freq: Once | INTRAMUSCULAR | Status: AC
  Administered 2017-05-14: 2 mg via INTRAMUSCULAR

## 2017-05-14 MED ORDER — ZIPRASIDONE MESYLATE 20 MG IM SOLR
INTRAMUSCULAR | Status: AC
  Administered 2017-05-14: 17:00:00
  Filled 2017-05-14: qty 20

## 2017-05-14 MED ORDER — LORAZEPAM 2 MG/ML IJ SOLN
INTRAMUSCULAR | Status: AC
  Filled 2017-05-14: qty 1

## 2017-05-14 MED ORDER — ZIPRASIDONE MESYLATE 20 MG IM SOLR: 20.0000 mg | Freq: Once | INTRAMUSCULAR | Status: DC

## 2017-05-14 NOTE — Progress Notes (Signed)
Clifford [name] Progress Note  05/14/2017 2:15 PM Clifford [name]  MRN:  [id number]  Subjective: Clifford [name] reports, "I'm doing better. Still hearing some voices & seeing stuff, but, not as much. I'm having the suicidal thoughts. But, I feel like I'm safe here".   Clifford [name] is a 47 y/o M with history of schizophrenia and bipolar disorder who was admitted voluntarily with worsening symptoms of depression, SI with plan to shoot self, AH, and illicit substance use of crack cocaine, heroin, methamphetamine, and alcohol. Pt has recent relevant history of discharge from prison in August 2018 and he was set up with outpatient services at [facility], and he reports good adherence to his outpatient regimen until about 1 week ago when he had relapse of substance use. Pt agreed to be restarted on previous medication of depakote and to attempt trial of seroquel. Pt worked with SW team about plan to seek substance use treatment at [facility] after stabilization at [facility].  Today upon evaluation, pt shares, "I'm doing better - starting to feel better;  the suicidal thoughts are coming down. Says, he feels safe in being here. He continues to endorse AVH, says not as intense. He is sleeping well. His appetite is good. He feels that his medications have been helpful, and he would like to continue with them. Pt remains in agreement with plan for transfer to [facility] for substance use treatment after he is stabilized at [facility], and we discussed potential discharge on morning of Tuesday 2/19, and pt felt that he would likely be ready at that point if he continued to have improvement of his presenting symptoms. Pt was in agreement with the above plan, and he had no further questions, comments, or concerns. Denies any new issues or concerns. He is encouraged to attend & participate in the group sessions.  Principal Problem: Schizoaffective disorder, bipolar type (HCC)  Diagnosis:   Patient Active Problem List   Diagnosis Date Noted  . Schizoaffective  disorder, bipolar type (HCC) [F25.0] 05/11/2017   Total Time spent with patient: 15 minutes  Past Psychiatric History: See H&P  Past Medical History:  Past Medical History:  Diagnosis Date  . Bipolar 1 disorder (HCC)    History reviewed. No pertinent surgical history.  Family History: History reviewed. No pertinent family history.  Family Psychiatric  History: See H&P  Social History:  Social History   Substance and Sexual Activity  Alcohol Use Yes   Comment: social     Social History   Substance and Sexual Activity  Drug Use Yes  . Types: Cocaine, Methamphetamines   Comment: heroin    Social History   Socioeconomic History  . Marital status: Single    Spouse name: None  . Number of children: None  . Years of education: None  . Highest education level: None  Social Needs  . Financial resource strain: None  . Food insecurity - worry: None  . Food insecurity - inability: None  . Transportation needs - medical: None  . Transportation needs - non-medical: None  Occupational History  . None  Tobacco Use  . Smoking status: Current Every Day Smoker  . Smokeless tobacco: Never Used  Substance and Sexual Activity  . Alcohol use: Yes    Comment: social  . Drug use: Yes    Types: Cocaine, Methamphetamines    Comment: heroin  . Sexual activity: None  Other Topics Concern  . None  Social History Narrative  . None   Additional Social History:   Sleep:  Good  Appetite:  Good  Current Medications: Current Facility-Administered Medications  Medication Dose Route Frequency Provider Last Rate Last Dose  . acetaminophen (TYLENOL) tablet 650 mg  650 mg Oral Q6H PRN Laveda AbbeParks, Laurie Britton, NP   650 mg at 05/13/17 1815  . alum & mag hydroxide-simeth (MAALOX/MYLANTA) 200-200-20 MG/5ML suspension 30 mL  30 mL Oral Q4H PRN Laveda AbbeParks, Laurie Britton, NP      . divalproex (DEPAKOTE) DR tablet 500 mg  500 mg Oral Q12H Micheal Likensainville, Christopher T, [name]   500 mg at 05/14/17 0758  .  gabapentin (NEURONTIN) capsule 300 mg  300 mg Oral TID Laveda AbbeParks, Laurie Britton, NP   300 mg at 05/14/17 1158  . hydrOXYzine (ATARAX/VISTARIL) tablet 25 mg  25 mg Oral TID PRN Laveda AbbeParks, Laurie Britton, NP      . magnesium hydroxide (MILK OF MAGNESIA) suspension 30 mL  30 mL Oral Daily PRN Laveda AbbeParks, Laurie Britton, NP      . nicotine polacrilex (NICORETTE) gum 2 mg  2 mg Oral PRN Micheal Likensainville, Christopher T, [name]      . ondansetron [facility](ZOFRAN) tablet 4 mg  4 mg Oral Q8H PRN Laveda AbbeParks, Laurie Britton, NP      . QUEtiapine (SEROQUEL) tablet 150 mg  150 mg Oral QHS Micheal Likensainville, Christopher T, [name]   150 mg at 05/13/17 2039  . traZODone (DESYREL) tablet 50 mg  50 mg Oral QHS PRN Laveda AbbeParks, Laurie Britton, NP        Lab Results:  Results for orders placed or performed during the hospital encounter of 05/11/17 (from the past 48 hour(s))  TSH     Status: Abnormal   Collection Time: 05/12/17  6:43 PM  Result Value Ref Range   TSH 0.346 (L) 0.350 - 4.500 uIU/mL    Comment: Performed by a 3rd Generation assay with a functional sensitivity of <=0.01 uIU/mL. Performed at [facility], [address] [place], [place] [address]   Lipid panel     Status: Abnormal   Collection Time: 05/12/17  6:43 PM  Result Value Ref Range   Cholesterol 177 0 - 200 mg/dL   Triglycerides 604125 <540<150 mg/dL   HDL 43 >98>40 mg/dL   Total CHOL/HDL Ratio 4.1 RATIO   VLDL 25 0 - 40 mg/dL   LDL Cholesterol [address]109 (H) 0 - 99 mg/dL    Comment:        Total Cholesterol/HDL:CHD Risk Coronary Heart Disease Risk Table                     Men   Women  1/2 Average Risk   3.4   3.3  Average Risk       5.0   4.4  2 X Average Risk   9.6   7.1  3 X Average Risk  23.4   11.0        Use the calculated Patient Ratio above and the CHD Risk Table to determine the patient's CHD Risk.        ATP III CLASSIFICATION (LDL):  <100     mg/dL   Optimal  147-829100-129  mg/dL   Near or Above                    Optimal  130-159  mg/dL   Borderline  562-130160-189  mg/dL    High  >865>190     mg/dL   Very High Performed at [facility], [address] [place], [place]  [address]   Hemoglobin A1c     Status: Abnormal   Collection Time: 05/12/17  6:43 PM  Result Value Ref Range   Hgb A1c MFr Bld 5.8 (H) 4.8 - 5.6 %    Comment: (NOTE) Pre diabetes:          5.7%-6.4% Diabetes:              >6.4% Glycemic control for   <7.0% adults with diabetes    Mean Plasma Glucose 119.76 mg/dL    Comment: Performed at [facility], [address] [place], [place] [address]  Prolactin     Status: None   Collection Time: 05/12/17  6:43 PM  Result Value Ref Range   Prolactin 14.3 4.0 - 15.2 ng/mL    Comment: (NOTE) Performed At: [facility] [address], [place] [id number] Jolene Schimke [name] [id number] Performed at [facility], [address] [place], [place] [address]     Blood Alcohol level:  Lab Results  Component Value Date   ETH <10 05/11/2017   Metabolic Disorder Labs: Lab Results  Component Value Date   HGBA1C 5.8 (H) 05/12/2017   MPG 119.76 05/12/2017   Lab Results  Component Value Date   PROLACTIN 14.3 05/12/2017   Lab Results  Component Value Date   CHOL 177 05/12/2017   TRIG 125 05/12/2017   HDL 43 05/12/2017   CHOLHDL 4.1 05/12/2017   VLDL 25 05/12/2017   LDLCALC 109 (H) 05/12/2017    Physical Findings: AIMS: Facial and Oral Movements Muscles of Facial Expression: None, normal Lips and Perioral Area: None, normal Jaw: None, normal Tongue: None, normal,Extremity Movements Upper (arms, wrists, hands, fingers): None, normal Lower (legs, knees, ankles, toes): None, normal, Trunk Movements Neck, shoulders, hips: None, normal, Overall Severity Severity of abnormal movements (highest score from questions above): None, normal Incapacitation due to abnormal movements: None, normal Patient's awareness of abnormal movements (rate only patient's report): No Awareness, Dental  Status Current problems with teeth and/or dentures?: No Does patient usually wear dentures?: No  CIWA:    COWS:     Musculoskeletal: Strength & Muscle Tone: within normal limits Gait & Station: normal Patient leans: N/A  Psychiatric Specialty Exam: Physical Exam  Nursing note and vitals reviewed.   Review of Systems  Constitutional: Negative for chills and fever.  Respiratory: Negative for cough and shortness of breath.   Cardiovascular: Negative for chest pain.  Gastrointestinal: Negative for abdominal pain, heartburn, nausea and vomiting.  Psychiatric/Behavioral: Positive for depression, hallucinations and suicidal ideas. The patient is nervous/anxious. The patient does not have insomnia.     Blood pressure (!) 144/85, pulse 70, temperature 97.9 F (36.6 C), temperature source Oral, resp. rate 18, height 5\' 10"  (1.778 m), weight 95.3 kg (210 lb).Body mass index is 30.13 kg/m.  General Appearance: Casual and Fairly Groomed  Eye Contact:  Good  Speech:  Clear and Coherent and Normal Rate  Volume:  Normal  Mood:  Anxious and Euthymic  Affect:  Appropriate, Congruent and Constricted  Thought Process:  Coherent and Goal Directed  Orientation:  Full (Time, Place, and Person)  Thought Content:  Hallucinations: Auditory  Suicidal Thoughts:  Yes.  without intent/plan  Homicidal Thoughts:  No  Memory:  Immediate;   Fair Recent;   Fair Remote;   Fair  Judgement:  Fair  Insight:  Fair  Psychomotor Activity:  Normal  Concentration:  Concentration: Fair  Recall:  [employer] of Knowledge:  Fair  Language:  Good  Akathisia:  No  Handed:    AIMS (if indicated):     Assets:  Communication Skills Physical Health Resilience Social Support  ADL's:  Intact  Cognition:  WNL  Sleep:  Number of Hours: 6.75   Treatment Plan Summary: Daily contact with patient to assess and evaluate symptoms and progress in treatment and Medication management. Pt reports improvement of AH, SI, and  depression since his admission, and he agrees to continue the dose of seroquel Q hs. We will plan to obtain depakote level on AM of 2/17. Pt is in agreement with plan for discharge to [facility] for substance use treatment after improvement of his presenting symptoms and we are potentially hoping for DC on AM of 2/19.  -Continue inpatient hospitalization.  - Will continue today 05/14/2017 plan as below except where it is noted.  -Schizoaffective disorder, bipolar type             - Continue depakote DR 500mg  po BID             - Continue Seroquel 150mg  po qhs  -Anxiety             - Continue gabapentin 300mg  po TID             - Continue atarax 25mg  po TID prn anxiety  -Insomnia             - Continue trazodone 50mg  po qhs prn insomnia  -Encourage participation in groups and the therapeutic milieu  -Discharge planning will be ongoing  Armandina Stammer, NP, PMHNP, FNP-BC. 05/14/2017, 2:15 PMPatient ID: Clifford [name], male   DOB: 05/28/1970, 47 y.o.   MRN: [id number]

## 2017-05-14 NOTE — Progress Notes (Signed)
D:Patient observed resting in bed with eyes closed. Respirations even and non labored.  A:Q 15 minute checks remain in progress for safety R:Patient remains safe on unit. 

## 2017-05-14 NOTE — Progress Notes (Signed)
DAR NOTE: Patient presents with anxious affect and depressed mood.  Denies pain, auditory and visual hallucinations.  Rates depression at 4, hopelessness at 4, and anxiety at 6.  Maintained on routine safety checks.  Medications given as prescribed.  Support and encouragement offered as needed.  Attended group and participated.  States goal for today is "to lose some of this energy."  Patient observed socializing with peers in the dayroom.  Offered no complaint.

## 2017-05-14 NOTE — Progress Notes (Signed)
Recreation Therapy Notes  Date: 05/14/17 Time: 1015 Location: 500 Hall Dayroom  Group Topic: Leisure Education  Goal Area(s) Addresses:  Patient will identify positive leisure activities.  Patient will identify one positive benefit of participation in leisure activities.   Intervention: Dry erase marker, white board  Activity: Pictionary.  The group was divided into two teams.  One person from the team would come to the board and draw the picture.  LRT would tell the person what to draw.  Each team would get one minute to guess the picture.  If the team did not guess the picture, the opposing team would get a chance to steal.  Education: Leisure Education, Discharge Planning  Education Outcome: Acknowledges education/In group clarification offered/Needs additional education  Clinical Observations/Feedback: Pt did not attend group.     Raynell Scott, LRT/CTRS          Jami Ohlin A 05/14/2017 12:23 PM 

## 2017-05-14 NOTE — Progress Notes (Signed)
Did not attend group 
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
## 2017-05-14 NOTE — BHH Group Notes (Signed)
[facility] Group Therapy 05/14/2017 1:15pm  Type of Therapy: Mental Health Association Presentation  Participation Level: Active  Participation Quality: Attentive  Affect: Appropriate  Cognitive: Oriented  Insight: Developing/Improving  Engagement in Therapy: Engaged  Modes of Intervention: Discussion, Education and Socialization  Summary of Progress/Problems: Mental Health Association (MHA) Speaker came to talk about his personal journey with mental health. The pt processed ways by which to relate to the speaker. MHA speaker provided handouts and educational information pertaining to groups and services offered by the [facility]. Pt was engaged in speaker's presentation and was receptive to resources provided.    Lorri FrederickWierda, Joliana Claflin Jon, LCSW 05/14/2017 3:44 PM

## 2017-05-15 MED ORDER — HYDROXYZINE HCL 25 MG PO TABS: 25.0000 mg | ORAL_TABLET | Freq: Three times a day (TID) | ORAL | 0 refills | Status: AC | PRN

## 2017-05-15 MED ORDER — NICOTINE POLACRILEX 2 MG MT GUM: 2.0000 mg | CHEWING_GUM | OROMUCOSAL | 0 refills | Status: DC | PRN

## 2017-05-15 MED ORDER — TRAZODONE HCL 50 MG PO TABS: 50.0000 mg | ORAL_TABLET | Freq: Every evening | ORAL | 0 refills | Status: AC | PRN

## 2017-05-15 MED ORDER — QUETIAPINE FUMARATE 50 MG PO TABS: 150.0000 mg | ORAL_TABLET | Freq: Every day | ORAL | 0 refills | Status: DC

## 2017-05-15 MED ORDER — GABAPENTIN 300 MG PO CAPS: 300.0000 mg | ORAL_CAPSULE | Freq: Three times a day (TID) | ORAL | 0 refills | Status: DC

## 2017-05-15 MED ORDER — DIVALPROEX SODIUM 500 MG PO DR TAB: 500.0000 mg | DELAYED_RELEASE_TABLET | Freq: Two times a day (BID) | ORAL | 0 refills | Status: DC

## 2017-05-15 MED ORDER — QUETIAPINE FUMARATE 50 MG PO TABS
150.0000 mg | ORAL_TABLET | Freq: Every day | ORAL | Status: DC
  Filled 2017-05-15: qty 21

## 2017-05-15 NOTE — Progress Notes (Signed)
  [facility] Discharge Plan :  Will you be returning to the same living situation after discharge:  No.Pt will be picked up by police and incarcerated upon discharge. At discharge, do you have transportation home?: No. Police transport. Do you have the ability to pay for your medications: No. Jail services.  Release of information consent forms completed and in the chart;  Patient's signature needed at discharge.  Patient to Follow up at: Follow-up Information    [facility], [facility] Follow [facility].   Why:  Please contact [facility] upon your release to arrange intake appointment.  Please bring photo ID and proof of [place] residency. Contact information: [address] [place] [address] [phone]           Next level of care provider has access to [facility] Link:no  Safety Planning and Suicide Prevention discussed: No. Pt refused.  SPE done with pt.  Have you used any form of tobacco in the last 30 days? (Cigarettes, Smokeless Tobacco, Cigars, and/or Pipes): Yes  Has patient been referred to the Quitline?: N/A.  Pt will be incarcerated upon discharge.  Patient has been referred for addiction treatment: Yes  Lorri FrederickWierda, Sani Madariaga Jon, LCSW 05/15/2017, 9:28 AM

## 2017-05-15 NOTE — Progress Notes (Signed)
D: Pt denies SI/HI/AV hallucinations. Pt is pleasant and cooperative. Pt was sleeping in bed. He did wake up to talk with this Clinical research associatewriter and take medications. A: Pt was offered support and encouragement. Pt was given scheduled medications. Pt was encourage to attend groups. Q 15 minute checks were done for safety.  R:Pt  Did not attends groups. Pt is taking medication. Pt has no complaints.Pt receptive to treatment and safety maintained on unit.
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
## 2017-05-15 NOTE — Progress Notes (Signed)
Patient discharged to [employer] custody. Patient was stable and appreciative at that time. All papers and prescriptions were given and valuables returned. Verbal understanding expressed. Denies SI/HI and A/VH. Patient given opportunity to express concerns and ask questions.
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
## 2017-08-11 ENCOUNTER — Encounter (HOSPITAL_COMMUNITY): Payer: Self-pay | Admitting: Emergency Medicine

## 2017-08-11 ENCOUNTER — Emergency Department (HOSPITAL_COMMUNITY)
Admission: EM | Admit: 2017-08-11 | Discharge: 2017-08-11 | Disposition: A | Discharge status: Home / Self Care | Payer: Self-pay | Attending: Emergency Medicine | Admitting: Emergency Medicine

## 2017-08-11 DIAGNOSIS — K029 Dental caries, unspecified: Secondary | ICD-10-CM | POA: Insufficient documentation

## 2017-08-11 DIAGNOSIS — R030 Elevated blood-pressure reading, without diagnosis of hypertension: Secondary | ICD-10-CM

## 2017-08-11 DIAGNOSIS — F17200 Nicotine dependence, unspecified, uncomplicated: Secondary | ICD-10-CM | POA: Insufficient documentation

## 2017-08-11 DIAGNOSIS — K047 Periapical abscess without sinus: Secondary | ICD-10-CM | POA: Insufficient documentation

## 2017-08-11 DIAGNOSIS — Z79899 Other long term (current) drug therapy: Secondary | ICD-10-CM | POA: Insufficient documentation

## 2017-08-11 MED ORDER — NAPROXEN 375 MG PO TABS: 375.0000 mg | ORAL_TABLET | Freq: Two times a day (BID) | ORAL | 0 refills | Status: DC

## 2017-08-11 MED ORDER — AMOXICILLIN 500 MG PO CAPS: 500.0000 mg | ORAL_CAPSULE | Freq: Three times a day (TID) | ORAL | 0 refills | Status: DC

## 2017-08-11 NOTE — ED Triage Notes (Signed)
Patient here from home with complaints of left sided dental pain x6 days.

## 2017-08-11 NOTE — Discharge Instructions (Signed)
Call Dr. Lucky Cowboy in the morning and tell the office that you were seen in the ED today and need a follow up appointment. You also need to follow up with a primary care doctor for your blood pressure.

## 2017-08-11 NOTE — ED Provider Notes (Signed)
[facility]-EMERGENCY DEPT Provider Note   CSN: [id number] Arrival date & time: 08/11/17  1706     History   Chief Complaint Chief Complaint  Patient presents with  . Dental Pain    HPI Clifford [name] is a 47 y.o. male who presents to the ED with dental pain. The pain is located in the left dental area. The pain started 6 days ago and has gotten worse. Patient reports he has been taking Aspirin for the pain with minimal relief.   HPI  Past Medical History:  Diagnosis Date  . Bipolar 1 disorder [facility])     Patient Active Problem List   Diagnosis Date Noted  . Schizoaffective disorder, bipolar type (HCC) 05/11/2017    History reviewed. No pertinent surgical history.      Home Medications    Prior to Admission medications   Medication Sig Start Date End Date Taking? Authorizing Provider  divalproex (DEPAKOTE) 500 MG DR tablet Take 1 tablet (500 mg total) by mouth every 12 (twelve) hours. For mood stabilization 05/15/17  Yes Nwoko, Nicole Kindred I, NP  gabapentin (NEURONTIN) 300 MG capsule Take 1 capsule (300 mg total) by mouth 3 (three) times daily. For agitation 05/15/17  Yes Armandina Stammer I, NP  hydrOXYzine (ATARAX/VISTARIL) 25 MG tablet Take 1 tablet (25 mg total) by mouth 3 (three) times daily as needed for anxiety. 05/15/17  Yes Armandina Stammer I, NP  QUEtiapine (SEROQUEL) 50 MG tablet Take 3 tablets (150 mg total) by mouth at bedtime. For mood control 05/15/17  Yes Armandina Stammer I, NP  traZODone (DESYREL) 50 MG tablet Take 1 tablet (50 mg total) by mouth at bedtime as needed for sleep. 05/15/17  Yes Armandina Stammer I, NP  amoxicillin (AMOXIL) 500 MG capsule Take 1 capsule (500 mg total) by mouth 3 (three) times daily. 08/11/17   Janne Napoleon, NP  naproxen (NAPROSYN) 375 MG tablet Take 1 tablet (375 mg total) by mouth 2 (two) times daily. 08/11/17   Janne Napoleon, NP    Family History No family history on file.  Social History Social History   Tobacco Use  . Smoking  status: Current Every Day Smoker  . Smokeless tobacco: Never Used  Substance Use Topics  . Alcohol use: Yes    Comment: social  . Drug use: Yes    Types: Cocaine, Methamphetamines    Comment: heroin     Allergies   Aspirin   Review of Systems Review of Systems  HENT: Positive for dental problem.   All other systems reviewed and are negative.    Physical Exam Updated Vital Signs BP (!) 166/102 (BP Location: Left Arm)   Pulse (!) 50   Temp 98.5 F (36.9 C) (Oral)   Resp 16   SpO2 100%   Physical Exam  Constitutional: He is oriented to person, place, and time. He appears well-developed and well-nourished. No distress.  HENT:  Head: Normocephalic.  Mouth/Throat: Uvula is midline and oropharynx is clear and moist.    Left lower first molar with crack, tender on exam, gum surrounding the tooth with erythema.   Eyes: EOM are normal.  Neck: Neck supple.  Cardiovascular: Bradycardia present.  Pulmonary/Chest: Effort normal.  Musculoskeletal: Normal range of motion.  Neurological: He is alert and oriented to person, place, and time. No cranial nerve deficit.  Skin: Skin is warm and dry.  Psychiatric: He has a normal mood and affect.  Nursing note and vitals reviewed.    ED Treatments /  Results  Labs (all labs ordered are listed, but only abnormal results are displayed) Labs Reviewed - No data to display  Radiology No results found.  Procedures Procedures (including critical care time)  Medications Ordered in ED Medications - No data to display   Initial Impression / Assessment and Plan / ED Course  I have reviewed the triage vital signs and the nursing notes. Patient with toothache.  No gross abscess.  Exam unconcerning for Ludwig's angina or spread of infection.  Will treat with amoxicillin and anti-inflammatories medicine.  Urged patient to follow-up with dentist referral given. Discussed elevated BP and need for follow up. Patient agrees with plan.     Final Clinical Impressions(s) / ED Diagnoses   Final diagnoses:  Infected dental caries  Elevated blood pressure reading    ED Discharge Orders        Ordered    naproxen (NAPROSYN) 375 MG tablet  2 times daily     08/11/17 1816    amoxicillin (AMOXIL) 500 MG capsule  3 times daily     08/11/17 1816       Kerrie Buffalo Liscomb, NP 08/11/17 2014    Lorre Nick, MD 08/16/17 [phone]

## 2017-11-08 ENCOUNTER — Other Ambulatory Visit: Payer: Self-pay

## 2017-11-08 ENCOUNTER — Encounter (HOSPITAL_COMMUNITY): Payer: Self-pay

## 2017-11-08 ENCOUNTER — Observation Stay (HOSPITAL_COMMUNITY)
Admission: EM | Admit: 2017-11-08 | Discharge: 2017-11-09 | Payer: Self-pay | Attending: Internal Medicine | Admitting: Internal Medicine

## 2017-11-08 ENCOUNTER — Emergency Department (HOSPITAL_COMMUNITY): Payer: Self-pay

## 2017-11-08 DIAGNOSIS — R0789 Other chest pain: Principal | ICD-10-CM | POA: Insufficient documentation

## 2017-11-08 DIAGNOSIS — R7989 Other specified abnormal findings of blood chemistry: Secondary | ICD-10-CM | POA: Insufficient documentation

## 2017-11-08 DIAGNOSIS — N289 Disorder of kidney and ureter, unspecified: Secondary | ICD-10-CM | POA: Insufficient documentation

## 2017-11-08 DIAGNOSIS — Z886 Allergy status to analgesic agent status: Secondary | ICD-10-CM | POA: Insufficient documentation

## 2017-11-08 DIAGNOSIS — F17210 Nicotine dependence, cigarettes, uncomplicated: Secondary | ICD-10-CM | POA: Insufficient documentation

## 2017-11-08 DIAGNOSIS — F250 Schizoaffective disorder, bipolar type: Secondary | ICD-10-CM | POA: Insufficient documentation

## 2017-11-08 DIAGNOSIS — R778 Other specified abnormalities of plasma proteins: Secondary | ICD-10-CM | POA: Diagnosis present

## 2017-11-08 DIAGNOSIS — R079 Chest pain, unspecified: Secondary | ICD-10-CM | POA: Diagnosis present

## 2017-11-08 DIAGNOSIS — F1410 Cocaine abuse, uncomplicated: Secondary | ICD-10-CM | POA: Insufficient documentation

## 2017-11-08 DIAGNOSIS — Z79899 Other long term (current) drug therapy: Secondary | ICD-10-CM | POA: Insufficient documentation

## 2017-11-08 DIAGNOSIS — Z8249 Family history of ischemic heart disease and other diseases of the circulatory system: Secondary | ICD-10-CM | POA: Insufficient documentation

## 2017-11-08 LAB — COMPREHENSIVE METABOLIC PANEL
ALK PHOS: 56 U/L (ref 38–126)
ALT: 17 U/L (ref 0–44)
ANION GAP: 10 (ref 5–15)
AST: 38 U/L (ref 15–41)
Albumin: 3.4 g/dL — ABNORMAL LOW (ref 3.5–5.0)
BUN: 10 mg/dL (ref 6–20)
CO2: 25 mmol/L (ref 22–32)
CREATININE: 1.26 mg/dL — AB (ref 0.61–1.24)
Calcium: 8.9 mg/dL (ref 8.9–10.3)
Chloride: 106 mmol/L (ref 98–111)
Glucose, Bld: 140 mg/dL — ABNORMAL HIGH (ref 70–99)
Potassium: 3.9 mmol/L (ref 3.5–5.1)
SODIUM: 141 mmol/L (ref 135–145)
Total Bilirubin: 0.6 mg/dL (ref 0.3–1.2)
Total Protein: 6.1 g/dL — ABNORMAL LOW (ref 6.5–8.1)

## 2017-11-08 LAB — CBC WITH DIFFERENTIAL/PLATELET
Abs Immature Granulocytes: 0.1 10*3/uL (ref 0.0–0.1)
BASOS ABS: 0.1 10*3/uL (ref 0.0–0.1)
BASOS PCT: 0 %
EOS ABS: 0 10*3/uL (ref 0.0–0.7)
EOS PCT: 0 %
HCT: 42.2 % (ref 39.0–52.0)
Hemoglobin: 14.1 g/dL (ref 13.0–17.0)
Immature Granulocytes: 0 %
Lymphocytes Relative: 11 %
Lymphs Abs: 1.6 10*3/uL (ref 0.7–4.0)
MCH: 28.8 pg (ref 26.0–34.0)
MCHC: 33.4 g/dL (ref 30.0–36.0)
MCV: 86.3 fL (ref 78.0–100.0)
MONO ABS: 1.1 10*3/uL — AB (ref 0.1–1.0)
MONOS PCT: 8 %
Neutro Abs: 11.1 10*3/uL — ABNORMAL HIGH (ref 1.7–7.7)
Neutrophils Relative %: 81 %
PLATELETS: 295 10*3/uL (ref 150–400)
RBC: 4.89 MIL/uL (ref 4.22–5.81)
RDW: 13.7 % (ref 11.5–15.5)
WBC: 14 10*3/uL — ABNORMAL HIGH (ref 4.0–10.5)

## 2017-11-08 LAB — I-STAT TROPONIN, ED: TROPONIN I, POC: 0.1 ng/mL — AB (ref 0.00–0.08)

## 2017-11-08 LAB — ETHANOL: Alcohol, Ethyl (B): <10 mg/dL (ref <10)

## 2017-11-08 LAB — TROPONIN I: Troponin I: <0.03 ng/mL (ref <0.03)

## 2017-11-08 MED ORDER — HYDROXYZINE HCL 25 MG PO TABS: 25.0000 mg | ORAL_TABLET | Freq: Three times a day (TID) | ORAL | Status: DC | PRN

## 2017-11-08 MED ORDER — ONDANSETRON HCL 4 MG/2ML IJ SOLN: 4.0000 mg | Freq: Four times a day (QID) | INTRAMUSCULAR | Status: DC | PRN

## 2017-11-08 MED ORDER — ENOXAPARIN SODIUM 40 MG/0.4ML ~~LOC~~ SOLN: 40.0000 mg | SUBCUTANEOUS | Status: DC

## 2017-11-08 MED ORDER — GI COCKTAIL ~~LOC~~: 30.0000 mL | Freq: Four times a day (QID) | ORAL | Status: DC | PRN

## 2017-11-08 MED ORDER — POTASSIUM CHLORIDE IN NACL 20-0.9 MEQ/L-% IV SOLN
INTRAVENOUS | Status: AC
  Administered 2017-11-08 – 2017-11-09 (×2): via INTRAVENOUS
  Filled 2017-11-08 (×3): qty 1000

## 2017-11-08 MED ORDER — SODIUM CHLORIDE 0.9 % IV BOLUS
1000.0000 mL | Freq: Once | INTRAVENOUS | Status: AC
  Administered 2017-11-08: 1000 mL via INTRAVENOUS

## 2017-11-08 MED ORDER — TRAZODONE HCL 50 MG PO TABS
50.0000 mg | ORAL_TABLET | Freq: Every evening | ORAL | Status: DC | PRN
Start: 2017-11-08 — End: 2017-11-09

## 2017-11-08 MED ORDER — ACETAMINOPHEN 325 MG PO TABS: 650.0000 mg | ORAL_TABLET | ORAL | Status: DC | PRN

## 2017-11-08 MED ORDER — MORPHINE SULFATE (PF) 2 MG/ML IV SOLN: 2.0000 mg | INTRAVENOUS | Status: DC | PRN
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
## 2017-11-08 NOTE — ED Notes (Signed)
Patient eating a turkey sandwich and drinking water.  

## 2017-11-08 NOTE — ED Triage Notes (Signed)
To room via EMS and [employer].  Pt handcuffed to bed rail.  [facility] police came, pt ran, once pt was caught by police, he c/o chest pain.   EMS BP 130/86, RR 18, HR 86, SpO2 97%.  EMS gave NTG x 1 and ASA 324 mg   Pt slept en route to hospital.  Last smoked crack this morning, smokes crack daily ($150), last did heroin 1 week ago and ICE occ but hasn't done in "a while".

## 2017-11-08 NOTE — H&P (Signed)
History and Physical  This is an unassigned medical admission  Clifford [name] KGM:[id number] DOB: [birth year] DOA: 11/08/2017  PCP: Patient, No Pcp Per  Patient coming from: Police custody  I have personally briefly reviewed patient's old medical records in [facility]  Chief Complaint: Chest pain which developed after using cocaine yesterday and running today  HPI: Clifford [name] is a 47 y.o. male with medical history significant of bipolar disorder and cocaine abuse who takes no medications at home.  He presents today with complaint of chest pain.  Yesterday the patient had enjoyed smoking some cocaine about a gram.  Today he was in [employer] shoplifting and was chased by the police.  After being caught by the police he started to complain of left substernal chest pain.  Describes the pain as sharp.  He denied any radiation of the pain.  He was given aspirin 325 mg and nitroglycerin 1 sublingual by EMS and his pain resolved.  On presentation in the emergency department he is without pain.  He denies a history of coronary artery disease or stents.  Has had no recent travel.  He denies any alcohol use.  History is provided by the patient.  He made the pain worse and nitroglycerin and aspirin made the pain better   ED Course: Contin 0.10.  EKG shows sinus rhythm with left ventricular hypertrophy and ST segment system with early polarization.  Review of Systems: As per HPI otherwise all other systems reviewed and  negative.    Past Medical History:  Diagnosis Date  . Bipolar 1 disorder (HCC)     History reviewed. No pertinent surgical history.  Social History   Social History Narrative  . Not on file     reports that he has been smoking. He has been smoking about 1.00 pack per day. He has never used smokeless tobacco. He reports that he drinks alcohol. He reports that he has current or past drug history. Drugs: Cocaine and Methamphetamines.  Allergies  Allergen Reactions  . Aspirin  Hives    Family History  Problem Relation Age of Onset  . Hypertension Mother   . Hypertension Sister      Prior to Admission medications   Medication Sig Start Date End Date Taking? Authorizing Provider  hydrOXYzine (ATARAX/VISTARIL) 25 MG tablet Take 1 tablet (25 mg total) by mouth 3 (three) times daily as needed for anxiety. 05/15/17  Yes Armandina Stammer I, NP  traZODone (DESYREL) 50 MG tablet Take 1 tablet (50 mg total) by mouth at bedtime as needed for sleep. 05/15/17  Yes Armandina Stammer I, NP    Physical Exam:  Constitutional: NAD, calm, comfortable Vitals:   11/08/17 1345 11/08/17 1415 11/08/17 1500 11/08/17 1608  BP: 118/79 126/86 126/89 (!) 140/95  Pulse: (!) 45 98 (!) 110 (!) 55  Resp: 14 12 14 18   Temp:    99.9 F (37.7 C)  TempSrc:    Oral  SpO2: 92% 96% 98% 100%  Weight:    79.6 kg  Height:    5\' 10"  (1.778 m)   Eyes: PERRL, lids and conjunctivae normal ENMT: Mucous membranes are moist. Posterior pharynx clear of any exudate or lesions.Normal dentition.  Neck: normal, supple, no masses, no thyromegaly Respiratory: clear to auscultation bilaterally, no wheezing, no crackles. Normal respiratory effort. No accessory muscle use.  Cardiovascular: Regular rate and rhythm, no murmurs / rubs / gallops. No extremity edema. 2+ pedal pulses. No carotid bruits.  Abdomen: no tenderness, no masses palpated. No  hepatosplenomegaly. Bowel sounds positive.  Musculoskeletal: no clubbing / cyanosis. No joint deformity upper and lower extremities. Good ROM, no contractures. Normal muscle tone.  Skin: no rashes, lesions, ulcers. No induration Neurologic: CN 2-12 grossly intact. Sensation intact, DTR normal. Strength 5/5 in all 4.  Psychiatric: Normal judgment and insight. Alert and oriented x 3. Normal mood.    Labs on Admission: I have personally reviewed following labs and imaging studies  CBC: Recent Labs  Lab 11/08/17 1246  WBC 14.0*  NEUTROABS 11.1*  HGB 14.1  HCT 42.2  MCV  86.3  PLT 295   Basic Metabolic Panel: Recent Labs  Lab 11/08/17 1246  NA 141  K 3.9  CL 106  CO2 25  GLUCOSE 140*  BUN 10  CREATININE 1.26*  CALCIUM 8.9   GFR: Estimated Creatinine Clearance: 76.4 mL/min (A) (by C-G formula based on SCr of 1.26 mg/dL (H)). Liver Function Tests: Recent Labs  Lab 11/08/17 1246  AST 38  ALT 17  ALKPHOS 56  BILITOT 0.6  PROT 6.1*  ALBUMIN 3.4*    Radiological Exams on Admission: Dg Chest Port 1 View  Result Date: 11/08/2017 CLINICAL DATA:  Chest pain. EXAM: PORTABLE CHEST 1 VIEW COMPARISON:  Chest CT and chest radiographs dated 03/16/2014. FINDINGS: Normal sized heart. Clear lungs. Minimal scoliosis. IMPRESSION: No acute abnormality. Electronically Signed   By: Beckie SaltsSteven  Reid M.D.   On: 11/08/2017 14:12    EKG: Independently reviewed.  Sinus tachycardia with early repolarization abnormality in sinus rhythm with LVH.  Assessment/Plan Principal Problem:   Chest pain Active Problems:   Elevated troponin   Cocaine abuse (HCC)   1.  Chest pain: Chest pain presents in the face of both exercise and cocaine use.  It is difficult to discern whether the patient has a coronary syndrome or simply having some vasospasm related to cocaine.  Will avoid beta-blockers.  We will continue nitrates as necessary.  We will hydrate the patient with 2 L of normal saline over the next 16 hours.  Pain medication in the form of morphine for severe chest pain.  Echocardiogram.  If troponins peak or echocardiogram is abnormal we will consider formal cardiology consultation.  2.  Elevated troponin: Likely related to enzyme leak from cocaine abuse and tachycardia associated with running from the police.  Will monitor results.  3.  Cocaine abuse: Clearly this is the etiology of most of the patient's problems.  Is about $150 a day and felt the need to shoplift today in order to feed his habit.  This is very concerning.  He might benefit from outpatient rehabilitation  referral.   DVT prophylaxis: Subcu Lovenox Code Status: Code Family Communication: Patient retains capacity and no family was present Disposition Plan: Likely to discharge in a.m. Consults called: None Admission status: Observation   Lahoma Crockerheresa C Sheehan MD FACP [facility] Pager [phone]  If 7PM-7AM, please contact night-coverage [email or web address] Password [email or web address]  11/08/2017, 4:45 PM

## 2017-11-08 NOTE — ED Notes (Signed)
MD Silverio LayYao notified about bradycardia into the 40's. Will continue to monitor.

## 2017-11-09 ENCOUNTER — Observation Stay (HOSPITAL_BASED_OUTPATIENT_CLINIC_OR_DEPARTMENT_OTHER): Payer: Self-pay

## 2017-11-09 ENCOUNTER — Ambulatory Visit (HOSPITAL_COMMUNITY): Payer: Self-pay

## 2017-11-09 DIAGNOSIS — R0789 Other chest pain: Secondary | ICD-10-CM

## 2017-11-09 DIAGNOSIS — R079 Chest pain, unspecified: Secondary | ICD-10-CM

## 2017-11-09 DIAGNOSIS — F1410 Cocaine abuse, uncomplicated: Secondary | ICD-10-CM

## 2017-11-09 LAB — TROPONIN I

## 2017-11-09 LAB — ECHOCARDIOGRAM COMPLETE
Height: 70 in
Weight: 2872 oz

## 2017-11-09 LAB — RAPID URINE DRUG SCREEN, HOSP PERFORMED
Amphetamines: NOT DETECTED
Barbiturates: NOT DETECTED
Benzodiazepines: NOT DETECTED
COCAINE: POSITIVE — AB
OPIATES: NOT DETECTED
TETRAHYDROCANNABINOL: NOT DETECTED

## 2017-11-09 LAB — HIV ANTIBODY (ROUTINE TESTING W REFLEX): HIV SCREEN 4TH GENERATION: NONREACTIVE

## 2017-11-09 NOTE — Plan of Care (Signed)
  Problem: Activity: Goal: Risk for activity intolerance will decrease Outcome: Progressing   Problem: Nutrition: Goal: Adequate nutrition will be maintained Outcome: Progressing   Problem: Coping: Goal: Level of anxiety will decrease Outcome: Progressing   Problem: Pain Managment: Goal: General experience of comfort will improve Outcome: Progressing   

## 2017-11-09 NOTE — Discharge Summary (Signed)
Physician Discharge Summary  Clifford [name] [id number] DOB: 02/03/1971 DOA: 11/08/2017  PCP: Patient, No Pcp Per  Admit date: 11/08/2017 Discharge date: 11/09/2017  Admitted From: home Discharge disposition: home (via jail)   Recommendations for Outpatient Follow-Up:   1. Cocaine cessation   Discharge Diagnosis:   Principal Problem:   Chest pain Active Problems:   Cocaine abuse (HCC)   Elevated troponin    Discharge Condition: Improved.  Diet recommendation: Low sodium, heart healthy  Wound care: None.  Code status: Full.   History of Present Illness:   Clifford [name] is a 47 y.o. male with medical history significant of bipolar disorder and cocaine abuse who takes no medications at home.  He presents today with complaint of chest pain.  Yesterday the patient had enjoyed smoking some cocaine about a gram.  Today he was in [employer] shoplifting and was chased by the police.  After being caught by the police he started to complain of left substernal chest pain.  Describes the pain as sharp.  He denied any radiation of the pain.  He was given aspirin 325 mg and nitroglycerin 1 sublingual by EMS and his pain resolved.  On presentation in the emergency department he is without pain.  He denies a history of coronary artery disease or stents.  Has had no recent travel.  He denies any alcohol use.  History is provided by the patient.  He made the pain worse and nitroglycerin and aspirin made the pain better   Hospital Course by Problem:   Chest pain:  -Chest pain presents in the face of both exercise and cocaine use.  -echo done and reported as normal- has not yet crossed over into epic  Mildly Elevated troponin: Likely related to enzyme leak from cocaine abuse and tachycardia associated with running from the police -resolved  Cocaine abuse:  -etiology of most of the patient's problems.  I -outpatient rehab  Renal insufficiency outpatient follow up    Medical  Consultants:      Discharge Exam:   Vitals:   11/09/17 0753 11/09/17 1119  BP: 129/87 129/79  Pulse: (!) 53 (!) 53  Resp: 17 18  Temp: 98.6 F (37 C) 98.5 F (36.9 C)  SpO2: 99% 100%   Vitals:   11/08/17 2347 11/09/17 0556 11/09/17 0753 11/09/17 1119  BP: 137/87 131/88 129/87 129/79  Pulse: (!) 50 (!) 55 (!) 53 (!) 53  Resp: 16 16 17 18   Temp: 98.9 F (37.2 C) 98.5 F (36.9 C) 98.6 F (37 C) 98.5 F (36.9 C)  TempSrc: Oral Oral Oral Oral  SpO2: 99% 99% 99% 100%  Weight:  81.4 kg    Height:        General exam: Appears calm and comfortable.     The results of significant diagnostics from this hospitalization (including imaging, microbiology, ancillary and laboratory) are listed below for reference.     Procedures and Diagnostic Studies:   Dg Chest Port 1 View  Result Date: 11/08/2017 CLINICAL DATA:  Chest pain. EXAM: PORTABLE CHEST 1 VIEW COMPARISON:  Chest CT and chest radiographs dated 03/16/2014. FINDINGS: Normal sized heart. Clear lungs. Minimal scoliosis. IMPRESSION: No acute abnormality. Electronically Signed   By: Beckie SaltsSteven  Reid M.D.   On: 11/08/2017 14:12     Labs:   Basic Metabolic Panel: Recent Labs  Lab 11/08/17 1246  NA 141  K 3.9  CL 106  CO2 25  GLUCOSE 140*  BUN 10  CREATININE 1.26*  CALCIUM 8.9   GFR Estimated Creatinine Clearance: 76.4 mL/min (A) (by C-G formula based on SCr of 1.26 mg/dL (H)). Liver Function Tests: Recent Labs  Lab 11/08/17 1246  AST 38  ALT 17  ALKPHOS 56  BILITOT 0.6  PROT 6.1*  ALBUMIN 3.4*   No results for input(s): LIPASE, AMYLASE in the last 168 hours. No results for input(s): AMMONIA in the last 168 hours. Coagulation profile No results for input(s): INR, PROTIME in the last 168 hours.  CBC: Recent Labs  Lab 11/08/17 1246  WBC 14.0*  NEUTROABS 11.1*  HGB 14.1  HCT 42.2  MCV 86.3  PLT 295   Cardiac Enzymes: Recent Labs  Lab 11/08/17 1632 11/08/17 2255 11/09/17 0431  TROPONINI <0.03  <0.03 <0.03   BNP: Invalid input(s): POCBNP CBG: No results for input(s): GLUCAP in the last 168 hours. D-Dimer No results for input(s): DDIMER in the last 72 hours. Hgb A1c No results for input(s): HGBA1C in the last 72 hours. Lipid Profile No results for input(s): CHOL, HDL, LDLCALC, TRIG, CHOLHDL, LDLDIRECT in the last 72 hours. Thyroid function studies No results for input(s): TSH, T4TOTAL, T3FREE, THYROIDAB in the last 72 hours.  Invalid input(s): FREET3 Anemia work up No results for input(s): VITAMINB12, FOLATE, FERRITIN, TIBC, IRON, RETICCTPCT in the last 72 hours. Microbiology No results found for this or any previous visit (from the past 240 hour(s)).   Discharge Instructions:   Discharge Instructions    Diet - low sodium heart healthy   Complete by:  As directed    Discharge instructions   Complete by:  As directed    Cocaine cessation   Increase activity slowly   Complete by:  As directed      Allergies as of 11/09/2017      Reactions   Aspirin Hives      Medication List    TAKE these medications   hydrOXYzine 25 MG tablet Commonly known as:  ATARAX/VISTARIL Take 1 tablet (25 mg total) by mouth 3 (three) times daily as needed for anxiety.   traZODone 50 MG tablet Commonly known as:  DESYREL Take 1 tablet (50 mg total) by mouth at bedtime as needed for sleep.         Time coordinating discharge: 25 min  Signed:  Joseph ArtJessica U Mariadejesus [name]  [facility] 11/09/2017, 3:08 PM

## 2017-11-09 NOTE — Progress Notes (Signed)
  Echocardiogram 2D Echocardiogram has been performed.  Clifford [name] 11/09/2017, 2:48 PM

## 2017-12-31 ENCOUNTER — Other Ambulatory Visit: Payer: Self-pay

## 2017-12-31 ENCOUNTER — Emergency Department (HOSPITAL_COMMUNITY)
Admission: EM | Admit: 2017-12-31 | Discharge: 2018-01-01 | Disposition: A | Discharge status: Home / Self Care | Payer: Self-pay | Attending: Emergency Medicine | Admitting: Emergency Medicine

## 2017-12-31 ENCOUNTER — Encounter (HOSPITAL_COMMUNITY): Payer: Self-pay | Admitting: Emergency Medicine

## 2017-12-31 ENCOUNTER — Emergency Department (HOSPITAL_COMMUNITY): Payer: Self-pay

## 2017-12-31 DIAGNOSIS — R079 Chest pain, unspecified: Secondary | ICD-10-CM | POA: Insufficient documentation

## 2017-12-31 DIAGNOSIS — F1410 Cocaine abuse, uncomplicated: Secondary | ICD-10-CM | POA: Insufficient documentation

## 2017-12-31 DIAGNOSIS — Z79899 Other long term (current) drug therapy: Secondary | ICD-10-CM | POA: Insufficient documentation

## 2017-12-31 DIAGNOSIS — F17210 Nicotine dependence, cigarettes, uncomplicated: Secondary | ICD-10-CM | POA: Insufficient documentation

## 2017-12-31 DIAGNOSIS — E876 Hypokalemia: Secondary | ICD-10-CM | POA: Insufficient documentation

## 2017-12-31 LAB — RAPID URINE DRUG SCREEN, HOSP PERFORMED
AMPHETAMINES: POSITIVE — AB
BARBITURATES: NOT DETECTED
BENZODIAZEPINES: NOT DETECTED
COCAINE: POSITIVE — AB
Opiates: NOT DETECTED
TETRAHYDROCANNABINOL: POSITIVE — AB

## 2017-12-31 LAB — CBC
HEMATOCRIT: 39.1 % (ref 39.0–52.0)
HEMOGLOBIN: 12.9 g/dL — AB (ref 13.0–17.0)
MCH: 29 pg (ref 26.0–34.0)
MCHC: 33 g/dL (ref 30.0–36.0)
MCV: 87.9 fL (ref 78.0–100.0)
Platelets: 284 10*3/uL (ref 150–400)
RBC: 4.45 MIL/uL (ref 4.22–5.81)
RDW: 13.5 % (ref 11.5–15.5)
WBC: 9.5 10*3/uL (ref 4.0–10.5)

## 2017-12-31 LAB — BASIC METABOLIC PANEL
Anion gap: 9 (ref 5–15)
BUN: 12 mg/dL (ref 6–20)
CHLORIDE: 106 mmol/L (ref 98–111)
CO2: 25 mmol/L (ref 22–32)
Calcium: 9 mg/dL (ref 8.9–10.3)
Creatinine, Ser: 1.21 mg/dL (ref 0.61–1.24)
GFR calc Af Amer: >60 mL/min (ref >60)
GFR calc non Af Amer: >60 mL/min (ref >60)
GLUCOSE: 114 mg/dL — AB (ref 70–99)
POTASSIUM: 2.8 mmol/L — AB (ref 3.5–5.1)
SODIUM: 140 mmol/L (ref 135–145)

## 2017-12-31 LAB — I-STAT TROPONIN, ED: Troponin i, poc: 0 ng/mL (ref 0.00–0.08)

## 2017-12-31 MED ORDER — POTASSIUM CHLORIDE 10 MEQ/100ML IV SOLN
10.0000 meq | Freq: Once | INTRAVENOUS | Status: AC
  Administered 2017-12-31: 10 meq via INTRAVENOUS
  Filled 2017-12-31: qty 100

## 2017-12-31 MED ORDER — POTASSIUM CHLORIDE CRYS ER 20 MEQ PO TBCR
40.0000 meq | EXTENDED_RELEASE_TABLET | Freq: Once | ORAL | Status: AC
  Administered 2017-12-31: 40 meq via ORAL
  Filled 2017-12-31: qty 2

## 2017-12-31 NOTE — ED Provider Notes (Signed)
[facility] EMERGENCY DEPARTMENT Provider Note   CSN: [id number] Arrival date & time: 12/31/17  1843     History   Chief Complaint Chief Complaint  Patient presents with  . Chest Pain    HPI Clifford [name] is a 47 y.o. male.  The history is provided by the patient and the police. No language interpreter was used.  Chest Pain       47 year old male with history of polysubstance abuse, bipolar disorder brought here via EMS in custody of the [facility] for evaluation of chest pain.  Patient admits to smoking crack on a regular basis.  Today, he mentioned he was standing outside of the building, when [facility] Department pulled up with their lights on, he sts he panicked and subsequently developed chest pain.  Incident started approximately 2 hours ago.  He described pain as a pressure sensation across his left chest, with lightheadedness and nausea and rates pain a 7 out of 10.  Pain started shortly after he saw the police.  He was given sublingual nitro but states he did not provide any relief.  Pain felt similar to prior chest pain episode a month and a half ago when he was admitted to the hospital for chest pain related to cocaine use.  Patient denies fever, chills, productive cough, hemoptysis, back pain or abdominal pain.  Please officer mention that patient is currently being arrested for larceny.  Past Medical History:  Diagnosis Date  . Bipolar 1 disorder [facility])     Patient Active Problem List   Diagnosis Date Noted  . Chest pain 11/08/2017  . Cocaine abuse (HCC) 11/08/2017  . Elevated troponin 11/08/2017  . Schizoaffective disorder, bipolar type (HCC) 05/11/2017    History reviewed. No pertinent surgical history.      Home Medications    Prior to Admission medications   Medication Sig Start Date End Date Taking? Authorizing Provider  hydrOXYzine (ATARAX/VISTARIL) 25 MG tablet Take 1 tablet (25 mg total) by mouth 3 (three)  times daily as needed for anxiety. 05/15/17   Armandina Stammer I, NP  traZODone (DESYREL) 50 MG tablet Take 1 tablet (50 mg total) by mouth at bedtime as needed for sleep. 05/15/17   Sanjuana Kava, NP    Family History Family History  Problem Relation Age of Onset  . Hypertension Mother   . Hypertension Sister     Social History Social History   Tobacco Use  . Smoking status: Current Every Day Smoker    Packs/day: 1.00  . Smokeless tobacco: Never Used  Substance Use Topics  . Alcohol use: Yes    Comment: social  . Drug use: Yes    Types: Cocaine, Methamphetamines    Comment: heroin     Allergies   Aspirin   Review of Systems Review of Systems  Cardiovascular: Positive for chest pain.  All other systems reviewed and are negative.    Physical Exam Updated Vital Signs BP 123/85 (BP Location: Right Arm)   Pulse 69   Temp 99.1 F (37.3 C) (Oral)   Resp 20   Ht 5\' 10"  (1.778 m)   Wt 81.6 kg   SpO2 97%   BMI 25.83 kg/m   Physical Exam  Constitutional: He is oriented to person, place, and time. He appears well-developed and well-nourished. No distress.  HENT:  Head: Atraumatic.  Eyes: Conjunctivae are normal.  Neck: Neck supple.  Cardiovascular: Normal rate, regular rhythm, intact distal pulses and normal pulses.  Pulmonary/Chest: Effort normal and breath sounds normal.  Abdominal: Soft. There is no tenderness.  Musculoskeletal:       Right lower leg: He exhibits no edema.       Left lower leg: He exhibits no edema.  Neurological: He is alert and oriented to person, place, and time.  Skin: No rash noted.  Psychiatric: He has a normal mood and affect.  Nursing note and vitals reviewed.    ED Treatments / Results  Labs (all labs ordered are listed, but only abnormal results are displayed) Labs Reviewed  BASIC METABOLIC PANEL - Abnormal; Notable for the following components:      Result Value   Potassium 2.8 (*)    Glucose, Bld 114 (*)    All other  components within normal limits  CBC - Abnormal; Notable for the following components:   Hemoglobin 12.9 (*)    All other components within normal limits  RAPID URINE DRUG SCREEN, HOSP PERFORMED - Abnormal; Notable for the following components:   Cocaine POSITIVE (*)    Amphetamines POSITIVE (*)    Tetrahydrocannabinol POSITIVE (*)    All other components within normal limits  I-STAT TROPONIN, ED  I-STAT TROPONIN, ED    EKG EKG Interpretation  Date/Time:  Thursday December 31 2017 18:47:06 EDT Ventricular Rate:  68 PR Interval:    QRS Duration: 83 QT Interval:  400 QTC Calculation: 426 R Axis:   35 Text Interpretation:  Sinus rhythm Consider left ventricular hypertrophy No significant change since last tracing Confirmed by Doug Sou [phone]) on 12/31/2017 7:07:05 PM   Radiology Dg Chest Port 1 View  Result Date: 12/31/2017 CLINICAL DATA:  Chest pain. EXAM: PORTABLE CHEST 1 VIEW COMPARISON:  Chest radiograph November 08, 2017 FINDINGS: Cardiomediastinal silhouette is normal. No pleural effusions or focal consolidations. Trachea projects midline and there is no pneumothorax. Soft tissue planes and included osseous structures are non-suspicious. IMPRESSION: Negative. Electronically Signed   By: Awilda Metro M.D.   On: 12/31/2017 19:38    Procedures Procedures (including critical care time)  Medications Ordered in ED Medications  potassium chloride SA (K-DUR,KLOR-CON) CR tablet 40 mEq (40 mEq Oral Given 12/31/17 2039)  potassium chloride 10 mEq in 100 mL IVPB (0 mEq Intravenous Stopped 12/31/17 2306)     Initial Impression / Assessment and Plan / ED Course  I have reviewed the triage vital signs and the nursing notes.  Pertinent labs & imaging results that were available during my care of the patient were reviewed by me and considered in my medical decision making (see chart for details).     BP 134/85   Pulse (!) 53   Temp 99.1 F (37.3 C) (Oral)   Resp 13   Ht  5\' 10"  (1.778 m)   Wt 81.6 kg   SpO2 99%   BMI 25.83 kg/m    Final Clinical Impressions(s) / ED Diagnoses   Final diagnoses:  Cocaine abuse (HCC)  Left-sided chest pain  Hypokalemia    ED Discharge Orders         Ordered    potassium chloride SA (K-DUR,KLOR-CON) 20 MEQ tablet  Daily     01/01/18 0033         7:20 PM Pt report developing chest pain after being arrested by [employer] for larceny.  Admits to smoking cracks today.  Was admitted a month ago for similar presentation, without evidence of ACS.  Work up initiated.   8:12 PM EKG without acute ischemic changes, initial troponin  is normal, labs remarkable for hypokalemia with a potassium of 2.8 but no EKG changes.  His chest x-ray is unremarkable.  Patient will receive supplemental potassium replenishment in the ED.  Will obtain delta troponin. HEART score of 2, low risk of ACS  12:25 AM Pt sign out to oncoming provider who will f/u on delta trop and dispo as appropriate.  Pt is current sxs free.  Care discussed with dr. Ethelda Chick.   Fayrene Helper, PA-C 01/01/18 0036    Doug Sou, MD 01/01/18 [phone]

## 2017-12-31 NOTE — ED Triage Notes (Signed)
Per [facility] pt c/o central chest pain non radiating that started earlier today after smoking 3 grams of crack. Pt given 1 nitro w/o relief. Not given aspirin due to allergy.

## 2018-01-01 LAB — I-STAT TROPONIN, ED: Troponin i, poc: 0 ng/mL (ref 0.00–0.08)

## 2018-01-01 MED ORDER — POTASSIUM CHLORIDE CRYS ER 20 MEQ PO TBCR: 20.0000 meq | EXTENDED_RELEASE_TABLET | Freq: Every day | ORAL | 0 refills | Status: AC

## 2018-01-01 NOTE — Discharge Instructions (Addendum)
Please avoid crack/cocaine use as it can worsen your chest pain.  Your potassium level is low, take supplementation as prescribed.

## 2018-01-01 NOTE — ED Provider Notes (Signed)
Care assumed from PA Fayrene Helper, please see his note for full details but in brief Clifford [name] is a 47 y.o. male who presents for evaluation of chest pain which occurred after using cocaine.  Initial work-up here has been reassuring and patient is now completely asymptomatic.  Plan is to follow-up on repeat troponin if negative patient can be discharged home with outpatient resources and primary care follow-up, did have mild hypokalemia here and will be discharged with a course of potassium.  Labs Reviewed  BASIC METABOLIC PANEL - Abnormal; Notable for the following components:      Result Value   Potassium 2.8 (*)    Glucose, Bld 114 (*)    All other components within normal limits  CBC - Abnormal; Notable for the following components:   Hemoglobin 12.9 (*)    All other components within normal limits  RAPID URINE DRUG SCREEN, HOSP PERFORMED - Abnormal; Notable for the following components:   Cocaine POSITIVE (*)    Amphetamines POSITIVE (*)    Tetrahydrocannabinol POSITIVE (*)    All other components within normal limits  I-STAT TROPONIN, ED  I-STAT TROPONIN, ED   Repeat troponin is negative and patient remains asymptomatic.  Will be discharged home, return precautions have been discussed and patient expresses understanding and is in agreement with plan.   Dartha Lodge, PA-C 01/01/18 0113    Doug Sou, MD 01/01/18 [phone]

## 2018-01-01 NOTE — ED Provider Notes (Signed)
Complained of anterior chest pain after smoking crack today.  He states he smokes crack almost daily.  He is presently asymptomatic since arrival here.  He denies any shortness of breath denies nausea or sweatiness.  Exam generally unkempt alert nontoxic lungs clear to auscultation heart regular rate and rhythm abdomen nondistended nontender chest is tender anteriorly.   Doug Sou, MD 01/01/18 [phone]

## 2019-05-02 DEATH — deceased

## 2019-12-30 IMAGING — DX DG CHEST 1V PORT
1 series · 1 of 1 positions shown · non-contrast
Comparison: Chest CT and chest radiographs dated 03/16/2014.

CLINICAL DATA: Chest pain.

EXAM:
PORTABLE CHEST 1 VIEW

[chest ap]
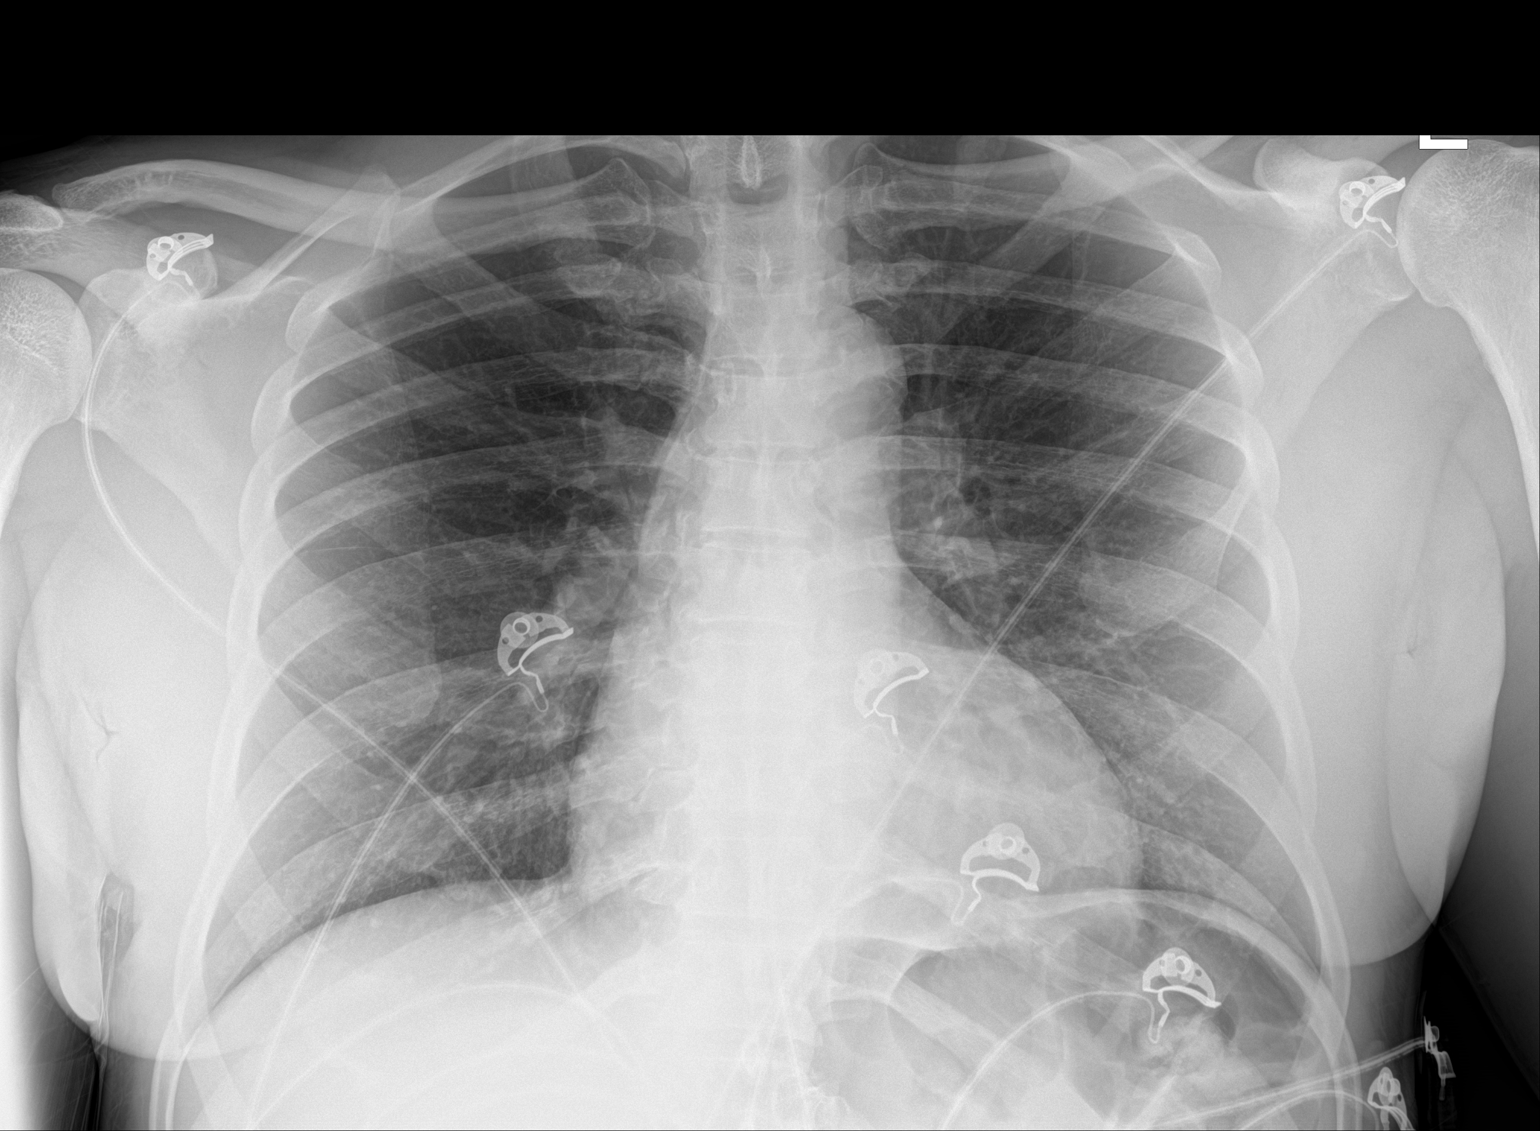

[1 of 1 positions shown; findings below may reference images not displayed]

FINDINGS: Normal sized heart. Clear lungs. Minimal scoliosis.
IMPRESSION: No acute abnormality.

## 2020-02-21 IMAGING — DX DG CHEST 1V PORT
1 series · 1 of 1 positions shown · non-contrast
Comparison: Chest radiograph November 08, 2017

CLINICAL DATA: Chest pain.

EXAM:
PORTABLE CHEST 1 VIEW

[chest]
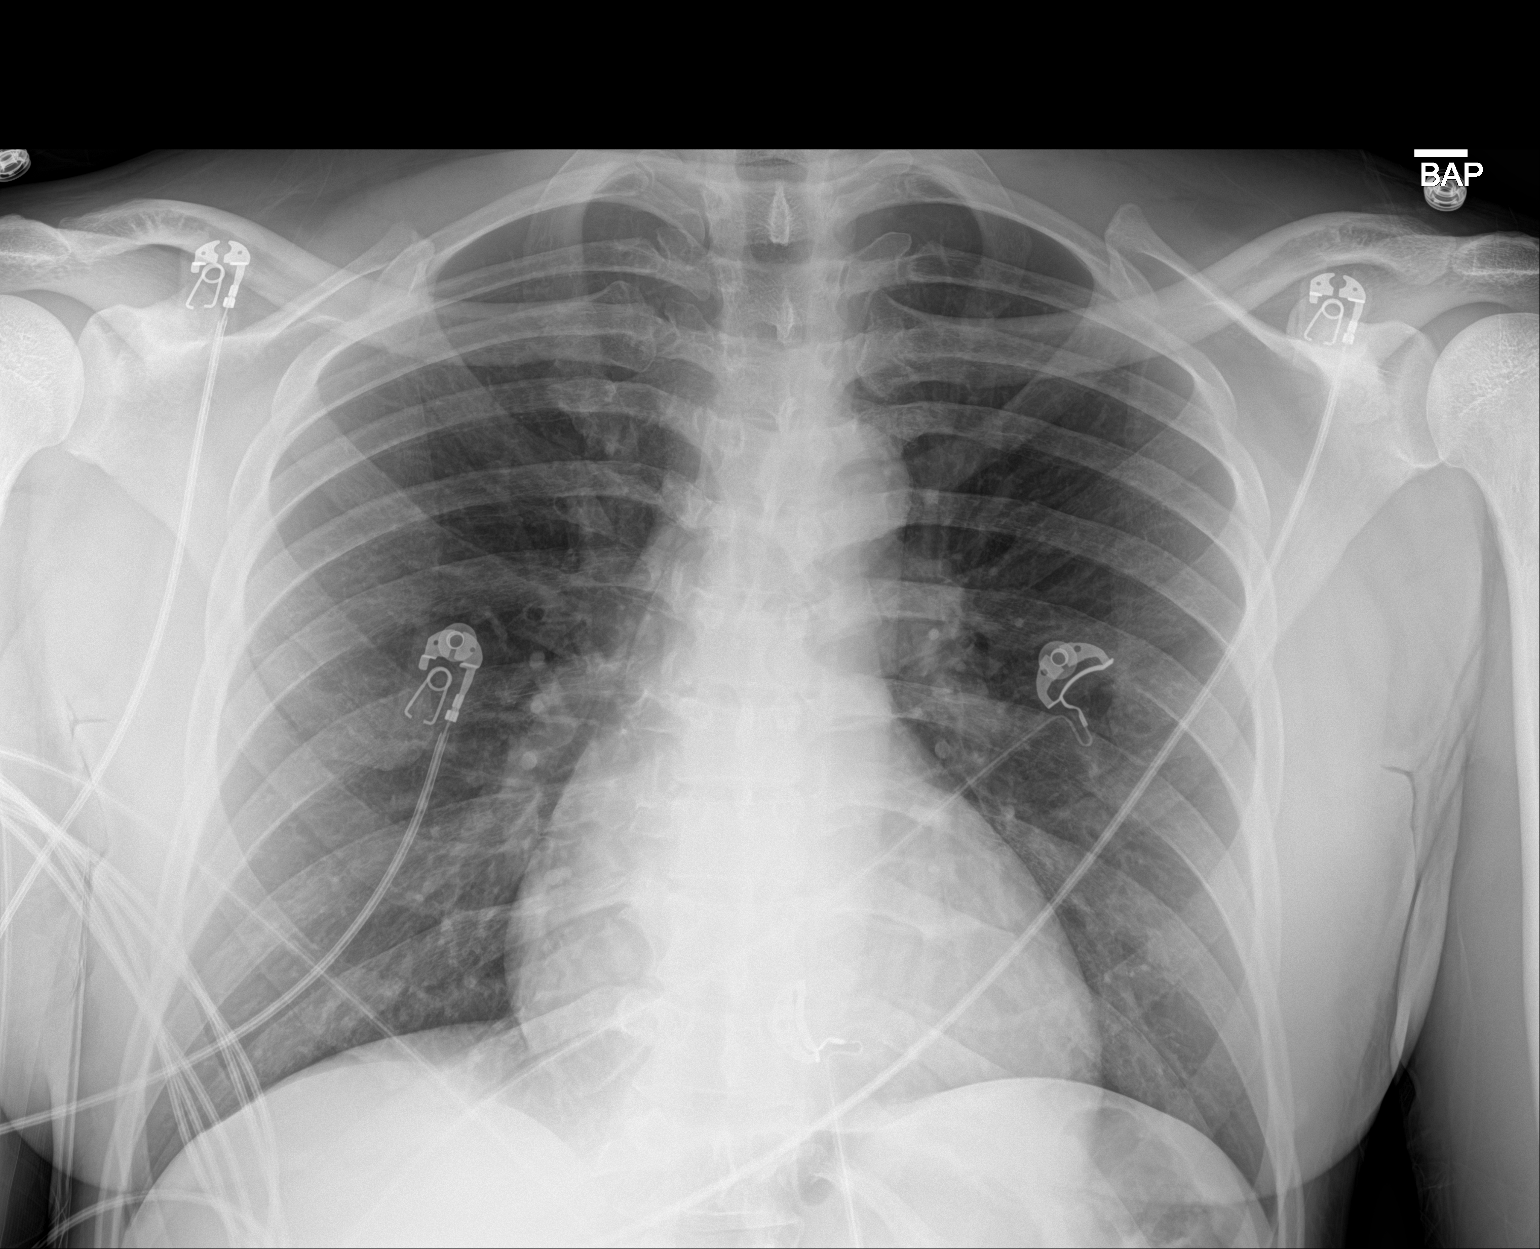

[1 of 1 positions shown; findings below may reference images not displayed]

FINDINGS: Cardiomediastinal silhouette is normal. No pleural effusions or
focal consolidations. Trachea projects midline and there is no
pneumothorax. Soft tissue planes and included osseous structures are
non-suspicious.
IMPRESSION: Negative.
